# Patient Record
Sex: Male | Born: 1980 | Race: White | Hispanic: No | Marital: Married | Smoking: Former smoker
Health system: Southern US, Community
[De-identification: ages and names within clinical notes are randomized; demographics above are authoritative.]

## PROBLEM LIST (undated history)

## (undated) DIAGNOSIS — D649 Anemia, unspecified: Secondary | ICD-10-CM

## (undated) DIAGNOSIS — F32A Depression, unspecified: Secondary | ICD-10-CM

## (undated) DIAGNOSIS — F329 Major depressive disorder, single episode, unspecified: Secondary | ICD-10-CM

## (undated) DIAGNOSIS — J45909 Unspecified asthma, uncomplicated: Secondary | ICD-10-CM

## (undated) HISTORY — PX: VASECTOMY: SHX75

## (undated) HISTORY — PX: ADENOIDECTOMY: SUR15

## (undated) HISTORY — DX: Anemia, unspecified: D64.9

## (undated) HISTORY — DX: Depression, unspecified: F32.A

## (undated) HISTORY — DX: Unspecified asthma, uncomplicated: J45.909

---
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
## 2011-10-24 ENCOUNTER — Emergency Department: Payer: Self-pay | Admitting: Emergency Medicine

## 2011-10-24 LAB — URINALYSIS, COMPLETE
Ketone: NEGATIVE
Nitrite: NEGATIVE
Protein: NEGATIVE
RBC,UR: <1 /HPF (ref 0–5)
WBC UR: <1 /HPF (ref 0–5)

## 2011-10-24 LAB — DRUG SCREEN, URINE

## 2011-10-24 LAB — TROPONIN I: Troponin-I: <0.02 ng/mL

## 2011-10-24 LAB — BASIC METABOLIC PANEL
BUN: 15 mg/dL (ref 7–18)
Chloride: 102 mmol/L (ref 98–107)
Glucose: 112 mg/dL — ABNORMAL HIGH (ref 65–99)
Osmolality: 277 (ref 275–301)
Potassium: 4 mmol/L (ref 3.5–5.1)
Sodium: 138 mmol/L (ref 136–145)

## 2011-10-24 LAB — CK TOTAL AND CKMB (NOT AT ARMC)
CK, Total: 796 U/L — ABNORMAL HIGH (ref 35–232)
CK-MB: 2.3 ng/mL (ref 0.5–3.6)

## 2011-10-24 LAB — CBC: RDW: 13.6 % (ref 11.5–14.5)

## 2012-02-25 ENCOUNTER — Emergency Department: Payer: Self-pay | Admitting: Emergency Medicine

## 2012-02-25 LAB — CBC
MCV: 90 fL (ref 80–100)
RBC: 4.69 10*6/uL (ref 4.40–5.90)
RDW: 15.9 % — ABNORMAL HIGH (ref 11.5–14.5)
WBC: 8.2 10*3/uL (ref 3.8–10.6)

## 2012-02-25 LAB — COMPREHENSIVE METABOLIC PANEL
Bilirubin,Total: 1.3 mg/dL — ABNORMAL HIGH (ref 0.2–1.0)
Chloride: 99 mmol/L (ref 98–107)
Co2: 26 mmol/L (ref 21–32)
Creatinine: 1.02 mg/dL (ref 0.60–1.30)
EGFR (African American): >60
EGFR (Non-African Amer.): >60
Glucose: 92 mg/dL (ref 65–99)
Osmolality: 268 (ref 275–301)
Sodium: 135 mmol/L — ABNORMAL LOW (ref 136–145)

## 2012-02-25 LAB — URINALYSIS, COMPLETE
Bilirubin,UR: NEGATIVE
Nitrite: NEGATIVE
Ph: 7 (ref 4.5–8.0)
Protein: NEGATIVE
Specific Gravity: 1.008 (ref 1.003–1.030)
Squamous Epithelial: NONE SEEN
WBC UR: <1 /HPF (ref 0–5)

## 2012-08-10 DIAGNOSIS — M7022 Olecranon bursitis, left elbow: Secondary | ICD-10-CM | POA: Insufficient documentation

## 2012-09-26 DIAGNOSIS — K469 Unspecified abdominal hernia without obstruction or gangrene: Secondary | ICD-10-CM | POA: Insufficient documentation

## 2012-09-27 DIAGNOSIS — F410 Panic disorder [episodic paroxysmal anxiety] without agoraphobia: Secondary | ICD-10-CM | POA: Insufficient documentation

## 2012-09-27 DIAGNOSIS — F32A Depression, unspecified: Secondary | ICD-10-CM | POA: Insufficient documentation

## 2013-04-10 ENCOUNTER — Emergency Department (HOSPITAL_COMMUNITY)
Admission: EM | Admit: 2013-04-10 | Discharge: 2013-04-10 | Disposition: A | Discharge status: Home / Self Care | Payer: Managed Care, Other (non HMO) | Attending: Emergency Medicine | Admitting: Emergency Medicine

## 2013-04-10 ENCOUNTER — Encounter (HOSPITAL_COMMUNITY): Payer: Self-pay | Admitting: Emergency Medicine

## 2013-04-10 ENCOUNTER — Other Ambulatory Visit: Payer: Self-pay

## 2013-04-10 DIAGNOSIS — Z79899 Other long term (current) drug therapy: Secondary | ICD-10-CM | POA: Insufficient documentation

## 2013-04-10 DIAGNOSIS — Z87891 Personal history of nicotine dependence: Secondary | ICD-10-CM | POA: Insufficient documentation

## 2013-04-10 DIAGNOSIS — R5381 Other malaise: Secondary | ICD-10-CM | POA: Insufficient documentation

## 2013-04-10 DIAGNOSIS — R55 Syncope and collapse: Secondary | ICD-10-CM | POA: Insufficient documentation

## 2013-04-10 LAB — POCT I-STAT, CHEM 8
BUN: 10 mg/dL (ref 6–23)
Chloride: 105 mEq/L (ref 96–112)
HCT: 40 % (ref 39.0–52.0)
Sodium: 135 mEq/L (ref 135–145)
TCO2: 26 mmol/L (ref 0–100)

## 2013-04-10 LAB — CBC WITH DIFFERENTIAL/PLATELET
Basophils Relative: 0 % (ref 0–1)
Eosinophils Absolute: 0.1 10*3/uL (ref 0.0–0.7)
Eosinophils Relative: 1 % (ref 0–5)
HCT: 40.5 % (ref 39.0–52.0)
Hemoglobin: 14.3 g/dL (ref 13.0–17.0)
Lymphocytes Relative: 18 % (ref 12–46)
Lymphs Abs: 2 10*3/uL (ref 0.7–4.0)
MCH: 30.8 pg (ref 26.0–34.0)
MCHC: 35.3 g/dL (ref 30.0–36.0)
MCV: 87.3 fL (ref 78.0–100.0)
Monocytes Absolute: 1 10*3/uL (ref 0.1–1.0)
Monocytes Relative: 9 % (ref 3–12)
Neutrophils Relative %: 73 % (ref 43–77)
RBC: 4.64 MIL/uL (ref 4.22–5.81)

## 2013-04-10 LAB — COMPREHENSIVE METABOLIC PANEL
Albumin: 4.6 g/dL (ref 3.5–5.2)
BUN: 11 mg/dL (ref 6–23)
CO2: 26 mEq/L (ref 19–32)
Calcium: 9.1 mg/dL (ref 8.4–10.5)
Creatinine, Ser: 1.06 mg/dL (ref 0.50–1.35)
GFR calc Af Amer: >90 mL/min (ref >90)
Total Protein: 7.2 g/dL (ref 6.0–8.3)

## 2013-04-10 LAB — GLUCOSE, CAPILLARY: Glucose-Capillary: 88 mg/dL (ref 70–99)

## 2013-04-10 MED ORDER — SODIUM CHLORIDE 0.9 % IV BOLUS (SEPSIS)
1000.0000 mL | Freq: Once | INTRAVENOUS | Status: AC
  Administered 2013-04-10: 1000 mL via INTRAVENOUS

## 2013-04-10 NOTE — ED Notes (Signed)
EKG was preformed late, due to the fact that the patient in room A3 was not out before EMS arrived with Joel Mills.

## 2013-04-10 NOTE — ED Notes (Signed)
Prior to discharge, pt ambulated around POD A with slow, steady gait. A&Ox4, denied nausea or dizziness.

## 2013-04-10 NOTE — ED Notes (Signed)
Pt given meal as requested by MD. Pt family feeding pt applesauce.

## 2013-04-10 NOTE — ED Notes (Addendum)
Pt in via EMS, per EMS- pt in s/p syncopal episode, states patient had a nerve block today for knee pain and hadn't eaten very much, around 1930 tonight patient felt dizzy and weak and was assisted to the ground by family, episode lasted approx 1 min, denies seizure like activity, was confused for a few min after, upon EMS arrival CBG was 71, given 12.5gm D50 and CBG increased 141, VS within normal limits, IV started PTA, L hand 20g and L AC 20g by EMS, alert and oriented at this time, given fentanyl PO and ativan PO during office procedure today also

## 2013-04-11 LAB — GLUCOSE, CAPILLARY: Glucose-Capillary: 88 mg/dL (ref 70–99)

## 2013-04-11 NOTE — ED Provider Notes (Signed)
CSN: [id number]     Arrival date & time 04/10/13  2017 History   First MD Initiated Contact with Patient 04/10/13 2046     Chief Complaint  Patient presents with  . Loss of Consciousness   (Consider location/radiation/quality/duration/timing/severity/associated sxs/prior Treatment) Patient is a 32 y.o. male presenting with syncope. The history is provided by the patient and the spouse. No language interpreter was used.  Loss of Consciousness Episode history:  Single Most recent episode:  Today Timing:  Rare Progression:  Resolved Chronicity:  New Context: standing up   Witnessed: yes   Relieved by:  Lying down Worsened by:  Posture Associated symptoms: no chest pain, no confusion, no dizziness, no fever, no headaches, no nausea, no shortness of breath, no vomiting and no weakness   Associated symptoms comment:  Fatigue   History reviewed. No pertinent past medical history. History reviewed. No pertinent past surgical history. No family history on file. History  Substance Use Topics  . Smoking status: Former Games developer  . Smokeless tobacco: Not on file  . Alcohol Use: Not on file    Review of Systems  Constitutional: Positive for fatigue. Negative for fever, activity change and appetite change.  HENT: Negative for congestion, facial swelling, rhinorrhea and trouble swallowing.   Eyes: Negative for photophobia and pain.  Respiratory: Negative for cough, chest tightness and shortness of breath.   Cardiovascular: Positive for syncope. Negative for chest pain and leg swelling.  Gastrointestinal: Negative for nausea, vomiting, abdominal pain, diarrhea and constipation.  Endocrine: Negative for polydipsia and polyuria.  Genitourinary: Negative for dysuria, urgency, decreased urine volume and difficulty urinating.  Musculoskeletal: Negative for back pain and gait problem.  Skin: Negative for color change, rash and wound.  Allergic/Immunologic: Negative for immunocompromised state.   Neurological: Positive for syncope. Negative for dizziness, facial asymmetry, speech difficulty, weakness, numbness and headaches.  Psychiatric/Behavioral: Negative for confusion, decreased concentration and agitation.    Allergies  Review of patient's allergies indicates no known allergies.  Home Medications   Current Outpatient Rx  Name  Route  Sig  Dispense  Refill  . omeprazole (PRILOSEC) 20 MG capsule   Oral   Take 20 mg by mouth daily.          BP 116/61  Pulse 66  Temp(Src) 97.5 F (36.4 C) (Oral)  Resp 11  SpO2 99% Physical Exam  Constitutional: He is oriented to person, place, and time. He appears well-developed and well-nourished. No distress.  HENT:  Head: Normocephalic and atraumatic.  Mouth/Throat: No oropharyngeal exudate.  Eyes: Pupils are equal, round, and reactive to light.  Neck: Normal range of motion. Neck supple.  Cardiovascular: Normal rate, regular rhythm and normal heart sounds.  Exam reveals no gallop and no friction rub.   No murmur heard. Pulmonary/Chest: Effort normal and breath sounds normal. No respiratory distress. He has no wheezes. He has no rales.  Abdominal: Soft. Bowel sounds are normal. He exhibits no distension and no mass. There is no tenderness. There is no rebound and no guarding.  Musculoskeletal: Normal range of motion. He exhibits no edema and no tenderness.  Neurological: He is alert and oriented to person, place, and time. He has normal strength. He displays no atrophy and no tremor. No cranial nerve deficit or sensory deficit. He exhibits normal muscle tone. He displays a negative Romberg sign. He displays no seizure activity. Coordination and gait normal. GCS eye subscore is 4. GCS verbal subscore is 5. GCS motor subscore is 6.  Skin:  Skin is warm and dry.  Psychiatric: He has a normal mood and affect.    ED Course  Procedures (including critical care time) Labs Review Labs Reviewed  CBC WITH DIFFERENTIAL - Abnormal;  Notable for the following:    WBC 11.3 (*)    Neutro Abs 8.2 (*)    All other components within normal limits  COMPREHENSIVE METABOLIC PANEL  GLUCOSE, CAPILLARY  POCT I-STAT, CHEM 8   Imaging Review No results found.  EKG Interpretation   None       MDM   1. Vasovagal syncope    Pt is a 32 y.o. male with Pmhx as above who presents with syncopal episode at home after having no PO intake for about 7 hours and having a nerve block done by pain clinic this afternoon.  On PE, pt sleepy but very easily awoken.  No focal neuro findings.  EKG unremarkable, VSS. CBC, CMP unremarkable.  Pt has received 1L NS, able to ambulate w/o difficulty.  Pt safe for d/c.  Return precautions given for new or worsening symptoms including continued episodes, focal neuro complaints.          Shanna Cisco, MD 04/11/13 [phone]

## 2016-05-22 ENCOUNTER — Encounter: Payer: Self-pay | Admitting: Emergency Medicine

## 2016-05-22 ENCOUNTER — Emergency Department: Payer: Managed Care, Other (non HMO)

## 2016-05-22 ENCOUNTER — Emergency Department
Admission: EM | Admit: 2016-05-22 | Discharge: 2016-05-22 | Disposition: A | Discharge status: Home / Self Care | Payer: Managed Care, Other (non HMO) | Attending: Emergency Medicine | Admitting: Emergency Medicine

## 2016-05-22 DIAGNOSIS — Y9351 Activity, roller skating (inline) and skateboarding: Secondary | ICD-10-CM | POA: Insufficient documentation

## 2016-05-22 DIAGNOSIS — Y929 Unspecified place or not applicable: Secondary | ICD-10-CM | POA: Insufficient documentation

## 2016-05-22 DIAGNOSIS — S60221A Contusion of right hand, initial encounter: Secondary | ICD-10-CM

## 2016-05-22 DIAGNOSIS — Y999 Unspecified external cause status: Secondary | ICD-10-CM | POA: Insufficient documentation

## 2016-05-22 DIAGNOSIS — S62114A Nondisplaced fracture of triquetrum [cuneiform] bone, right wrist, initial encounter for closed fracture: Secondary | ICD-10-CM | POA: Insufficient documentation

## 2016-05-22 DIAGNOSIS — Z87891 Personal history of nicotine dependence: Secondary | ICD-10-CM | POA: Insufficient documentation

## 2016-05-22 MED ORDER — HYDROCODONE-ACETAMINOPHEN 5-325 MG PO TABS: 1.0000 | ORAL_TABLET | ORAL | 0 refills | Status: DC | PRN

## 2016-05-22 NOTE — ED Triage Notes (Signed)
Pt fell snow boarding (on a [employer] board) and landed on his rt hand. Pain goes up the arm.
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
## 2016-05-22 NOTE — Discharge Instructions (Signed)
Ice and elevate as needed for pain and swelling. Wear splint for protection and support. Norco as needed for pain.  Make an appointment with Dr. Elenor LegatoHooten's office who is the orthopedist on call this weekend. You will need to have a follow-up appointment to make sure that your wrist is healing.

## 2016-05-22 NOTE — ED Notes (Signed)
NAD noted at time of D/C. Pt denies questions or concerns. Pt ambulatory to the lobby at this time.  

## 2017-11-10 ENCOUNTER — Emergency Department: Payer: Managed Care, Other (non HMO)

## 2017-11-10 ENCOUNTER — Encounter: Payer: Self-pay | Admitting: Emergency Medicine

## 2017-11-10 ENCOUNTER — Emergency Department
Admission: EM | Admit: 2017-11-10 | Discharge: 2017-11-11 | Disposition: A | Discharge status: Home / Self Care | Payer: Managed Care, Other (non HMO) | Attending: Emergency Medicine | Admitting: Emergency Medicine

## 2017-11-10 ENCOUNTER — Other Ambulatory Visit: Payer: Self-pay

## 2017-11-10 DIAGNOSIS — F17200 Nicotine dependence, unspecified, uncomplicated: Secondary | ICD-10-CM | POA: Insufficient documentation

## 2017-11-10 DIAGNOSIS — R42 Dizziness and giddiness: Secondary | ICD-10-CM | POA: Insufficient documentation

## 2017-11-10 DIAGNOSIS — Z79899 Other long term (current) drug therapy: Secondary | ICD-10-CM | POA: Insufficient documentation

## 2017-11-10 DIAGNOSIS — R0602 Shortness of breath: Secondary | ICD-10-CM | POA: Insufficient documentation

## 2017-11-10 DIAGNOSIS — R0789 Other chest pain: Secondary | ICD-10-CM | POA: Insufficient documentation

## 2017-11-10 DIAGNOSIS — F419 Anxiety disorder, unspecified: Secondary | ICD-10-CM | POA: Insufficient documentation

## 2017-11-10 DIAGNOSIS — R112 Nausea with vomiting, unspecified: Secondary | ICD-10-CM | POA: Insufficient documentation

## 2017-11-10 DIAGNOSIS — R079 Chest pain, unspecified: Secondary | ICD-10-CM

## 2017-11-10 LAB — BASIC METABOLIC PANEL
Anion gap: 8 (ref 5–15)
BUN: 10 mg/dL (ref 6–20)
CHLORIDE: 107 mmol/L (ref 98–111)
CO2: 26 mmol/L (ref 22–32)
CREATININE: 1.06 mg/dL (ref 0.61–1.24)
Calcium: 9.4 mg/dL (ref 8.9–10.3)
GFR calc Af Amer: >60 mL/min (ref >60)
GFR calc non Af Amer: >60 mL/min (ref >60)
Glucose, Bld: 93 mg/dL (ref 70–99)
Potassium: 3.6 mmol/L (ref 3.5–5.1)
SODIUM: 141 mmol/L (ref 135–145)

## 2017-11-10 LAB — CBC
HCT: 44.3 % (ref 40.0–52.0)
Hemoglobin: 15.8 g/dL (ref 13.0–18.0)
MCH: 31.6 pg (ref 26.0–34.0)
MCHC: 35.6 g/dL (ref 32.0–36.0)
MCV: 88.9 fL (ref 80.0–100.0)
PLATELETS: 188 10*3/uL (ref 150–440)
RBC: 4.98 MIL/uL (ref 4.40–5.90)
RDW: 13.5 % (ref 11.5–14.5)
WBC: 7.7 10*3/uL (ref 3.8–10.6)

## 2017-11-10 LAB — TROPONIN I: Troponin I: <0.03 ng/mL (ref <0.03)

## 2017-11-10 NOTE — ED Triage Notes (Signed)
Pt arrives POV to triage with c/o chest pain x a few months. Pt reports that he was here at around 1930 but was sitting in the parking lot to see if it went away. Pt reports that he left and was told to come back from a friend. Pt is in NAD.

## 2017-11-11 ENCOUNTER — Emergency Department: Payer: Self-pay

## 2017-11-11 LAB — TROPONIN I: Troponin I: <0.03 ng/mL (ref <0.03)

## 2017-11-11 MED ORDER — ASPIRIN 81 MG PO CHEW
324.0000 mg | CHEWABLE_TABLET | Freq: Once | ORAL | Status: AC
Start: 2017-11-11 — End: 2017-11-11
  Administered 2017-11-11: 324 mg via ORAL
  Filled 2017-11-11: qty 4

## 2017-11-11 MED ORDER — GI COCKTAIL ~~LOC~~
30.0000 mL | Freq: Once | ORAL | Status: AC
  Administered 2017-11-11: 30 mL via ORAL
  Filled 2017-11-11: qty 30

## 2017-11-11 NOTE — ED Provider Notes (Signed)
[facility] Emergency Department Provider Note   ____________________________________________   First MD Initiated Contact with Patient 11/10/17 2355     (approximate)  I have reviewed the triage vital signs and the nursing notes.   HISTORY  Chief Complaint Chest Pain    HPI Joel Mills is a 37 y.o. male who comes into the hospital today with 3 months of intermittent left-sided chest pain.  The patient states that he would have some chest pain that would take his breath away.  It would feel like someone was standing or his Annia FriendlyChester someone was squeezing his chest.  The patient has been having also symptoms of vertigo.  He reports that it usually occurs when he goes from kneeling to standing or sitting to standing.  He gets up and it seems like his head is spinning.  Those episodes last about 30 seconds but he says that the chest pain will last for about 5 minutes.  The patient states that today he felt like he was having an anxiety attack which made all the symptoms worse.  He had some shortness of breath with the chest pain as well as nausea and he vomited once.  The patient denies any pain right now and states that his chest is just sore.  He rates his soreness a 2 out of 10 in intensity.  The patient states that he has a friend who is younger than him who is having some heart problems so he was concerned about his own symptoms.  He is here for evaluation.  History reviewed. No pertinent past medical history.  There are no active problems to display for this patient.   History reviewed. No pertinent surgical history.  Prior to Admission medications   Medication Sig Start Date End Date Taking? Authorizing Provider  HYDROcodone-acetaminophen (NORCO/VICODIN) 5-325 MG tablet Take 1 tablet by mouth every 4 (four) hours as needed for moderate pain. 05/22/16   Tommi RumpsSummers, Rhonda L, PA-C  omeprazole (PRILOSEC) 20 MG capsule Take 20 mg by mouth daily.    [provider]    Allergies Patient has no known allergies.  No family history on file.  Social History Social History   Tobacco Use  . Smoking status: Current Every Day Smoker  . Smokeless tobacco: Never Used  Substance Use Topics  . Alcohol use: Yes    Comment: socially  . Drug use: Yes    Types: Marijuana    Review of Systems  Constitutional: No fever/chills Eyes: No visual changes. ENT: No sore throat. Cardiovascular:  chest pain. Respiratory: shortness of breath. Gastrointestinal: Nausea and vomiting with no abdominal pain.  No diarrhea.  No constipation. Genitourinary: Negative for dysuria. Musculoskeletal: Negative for back pain. Skin: Negative for rash. Neurological: Dizziness   ____________________________________________   PHYSICAL EXAM:  VITAL SIGNS: ED Triage Vitals  Enc Vitals Group     BP 11/10/17 2220 135/85     Pulse Rate 11/10/17 2220 62     Resp 11/10/17 2220 18     Temp 11/10/17 2220 98.4 F (36.9 C)     Temp Source 11/10/17 2220 Oral     SpO2 11/10/17 2220 96 %     Weight 11/10/17 2218 193 lb (87.5 kg)     Height 11/10/17 2218 6\' 3"  (1.905 m)     Head Circumference --      Peak Flow --      Pain Score 11/10/17 2218 2     Pain Loc --  Pain Edu? --      Excl. in GC? --     Constitutional: Alert and oriented. Well appearing and in mild distress. Eyes: Conjunctivae are normal. PERRL. EOMI. Head: Atraumatic. Nose: No congestion/rhinnorhea. Mouth/Throat: Mucous membranes are moist.  Oropharynx non-erythematous. Cardiovascular: Normal rate, regular rhythm. Grossly normal heart sounds.  Good peripheral circulation. Respiratory: Normal respiratory effort.  No retractions. Lungs CTAB. Gastrointestinal: Soft and nontender. No distention.  Positive bowel sounds Musculoskeletal: No lower extremity tenderness nor edema.  . Neurologic:  Normal speech and language.  Skin:  Skin is warm, dry and intact.  Psychiatric: Mood and affect are  normal.  ____________________________________________   LABS (all labs ordered are listed, but only abnormal results are displayed)  Labs Reviewed  BASIC METABOLIC PANEL  CBC  TROPONIN I  TROPONIN I   ____________________________________________  EKG  ED ECG REPORT I, Rebecka Apley, the attending physician, personally viewed and interpreted this ECG.   Date: 11/10/2017  EKG Time: 2221  Rate: 69  Rhythm: normal sinus rhythm with sinus arrythmia  Axis: normal  Intervals:none  ST&T Change: none  ____________________________________________  RADIOLOGY  ED MD interpretation:  CXR: Stable normal chest radiograph  CT head: Normal head CT  Official radiology report(s): Dg Chest 2 View  Result Date: 11/10/2017 CLINICAL DATA:  37 y/o  M; chest pain. EXAM: CHEST - 2 VIEW COMPARISON:  02/25/2012 chest radiograph. FINDINGS: Stable heart size and mediastinal contours are within normal limits. Both lungs are clear. The visualized skeletal structures are unremarkable. IMPRESSION: Stable normal chest radiograph. Electronically Signed   By: Mitzi Hansen M.D.   On: 11/10/2017 22:37   Ct Head Wo Contrast  Result Date: 11/11/2017 CLINICAL DATA:  Chest pain and vertigo. EXAM: CT HEAD WITHOUT CONTRAST TECHNIQUE: Contiguous axial images were obtained from the base of the skull through the vertex without intravenous contrast. COMPARISON:  None. FINDINGS: Brain: There is no mass, hemorrhage or extra-axial collection. The size and configuration of the ventricles and extra-axial CSF spaces are normal. There is no acute or chronic infarction. The brain parenchyma is normal. Vascular: No abnormal hyperdensity of the major intracranial arteries or dural venous sinuses. No intracranial atherosclerosis. Skull: The visualized skull base, calvarium and extracranial soft tissues are normal. Sinuses/Orbits: No fluid levels or advanced mucosal thickening of the visualized paranasal sinuses. No  mastoid or middle ear effusion. The orbits are normal. IMPRESSION: Normal head CT. Electronically Signed   By: Deatra Robinson M.D.   On: 11/11/2017 01:14    ____________________________________________   PROCEDURES  Procedure(s) performed: None  Procedures  Critical Care performed: No  ____________________________________________   INITIAL IMPRESSION / ASSESSMENT AND PLAN / ED COURSE  As part of my medical decision making, I reviewed the following data within the electronic MEDICAL RECORD NUMBER Notes from prior ED visits and [place] Controlled Substance Database   This is a 37 year old male who comes into the hospital today with some intermittent chest pain for multiple months and some dizziness.  The patient was concerned as the symptoms have been increasing in frequency.  My differential diagnosis includes acute coronary syndrome, vertigo, anxiety.  We did check some blood work to include a CBC a BMP and a troponin.  The patient's blood work is unremarkable.  He also had a chest x-ray and a CT of his head which were negative.  The patient's repeat troponin is also negative.  He will be discharged home and encouraged to follow-up with the acute care clinic or  his primary care physician for further evaluation of the symptoms.      ____________________________________________   FINAL CLINICAL IMPRESSION(S) / ED DIAGNOSES  Final diagnoses:  Chest pain, unspecified type  Dizziness     ED Discharge Orders    None       Note:  This document was prepared using Dragon voice recognition software and may include unintentional dictation errors.    Rebecka Apley, MD 11/11/17 [phone]

## 2017-11-11 NOTE — Discharge Instructions (Addendum)
Please follow-up with the acute care clinic for further evaluation of your chest pain and dizziness symptoms.  Please ensure that you are drinking adequate fluids and please return with any worsening symptoms or any other concerns.

## 2017-12-18 ENCOUNTER — Emergency Department: Payer: Self-pay

## 2017-12-18 ENCOUNTER — Emergency Department
Admission: EM | Admit: 2017-12-18 | Discharge: 2017-12-18 | Disposition: A | Discharge status: Home / Self Care | Payer: Self-pay | Attending: Emergency Medicine | Admitting: Emergency Medicine

## 2017-12-18 ENCOUNTER — Other Ambulatory Visit: Payer: Self-pay

## 2017-12-18 ENCOUNTER — Encounter: Payer: Self-pay | Admitting: Emergency Medicine

## 2017-12-18 DIAGNOSIS — Z79899 Other long term (current) drug therapy: Secondary | ICD-10-CM | POA: Insufficient documentation

## 2017-12-18 DIAGNOSIS — F17200 Nicotine dependence, unspecified, uncomplicated: Secondary | ICD-10-CM | POA: Insufficient documentation

## 2017-12-18 DIAGNOSIS — M5432 Sciatica, left side: Secondary | ICD-10-CM | POA: Insufficient documentation

## 2017-12-18 MED ORDER — METHOCARBAMOL 500 MG PO TABS: 500.0000 mg | ORAL_TABLET | Freq: Every day | ORAL | 1 refills | Status: DC

## 2017-12-18 MED ORDER — MELOXICAM 15 MG PO TABS: 15.0000 mg | ORAL_TABLET | Freq: Every day | ORAL | 1 refills | Status: DC

## 2017-12-18 NOTE — ED Notes (Signed)
See triage note  Presents with lower back pain   States he has had ower back pain in the past  But usually pain moves into buttocks  Now states pain is moving into left leg  Ambulates slowly d/t pain   Denies any recent injury

## 2017-12-18 NOTE — ED Provider Notes (Signed)
[facility] Emergency Department Provider Note  ____________________________________________  Time seen: Approximately 4:16 PM  I have reviewed the triage vital signs and the nursing notes.   HISTORY  Chief Complaint Back Pain    HPI Joel Mills is a 37 y.o. male who presents the emergency department complaining of increasing lower back pain with radicular symptoms down the left leg.  Patient reports that he has had intermittent lower back pain but more radicular symptoms down his left lower leg for a long period of time.  Patient reports over the past several weeks radicular symptoms have been increasing.  Patient reports that he has had some "popping" in his lower back recently with increasing pain and radicular symptoms.  No bowel or bladder dysfunction, saddle anesthesia, paresthesias.  No urinary complaints.  No GI complaints.  No medications for this complaint prior to arrival.  No other complaints at this time.    History reviewed. No pertinent past medical history.  There are no active problems to display for this patient.   History reviewed. No pertinent surgical history.  Prior to Admission medications   Medication Sig Start Date End Date Taking? Authorizing Provider  HYDROcodone-acetaminophen (NORCO/VICODIN) 5-325 MG tablet Take 1 tablet by mouth every 4 (four) hours as needed for moderate pain. 05/22/16   Joel Mills, Joel Mills, Joel Mills  meloxicam (MOBIC) 15 MG tablet Take 1 tablet (15 mg total) by mouth daily. 12/18/17   Joel Mills, Joel Mills, Joel Mills  methocarbamol (ROBAXIN) 500 MG tablet Take 1 tablet (500 mg total) by mouth at bedtime. 12/18/17   Joel Mills, Joel Mills, Joel Mills  omeprazole (PRILOSEC) 20 MG capsule Take 20 mg by mouth daily.    [provider]    Allergies Patient has no known allergies.  No family history on file.  Social History Social History   Tobacco Use  . Smoking status: Current Every Day Smoker  . Smokeless tobacco:  Never Used  Substance Use Topics  . Alcohol use: Yes    Comment: socially  . Drug use: Yes    Types: Marijuana     Review of Systems  Constitutional: No fever/chills Eyes: No visual changes. No discharge ENT: No upper respiratory complaints. Cardiovascular: no chest pain. Respiratory: no cough. No SOB. Gastrointestinal: No abdominal pain.  No nausea, no vomiting.  No diarrhea.  No constipation. Genitourinary: Negative for dysuria. No hematuria Musculoskeletal: Positive for left lower back pain with radicular symptoms down the left leg Skin: Negative for rash, abrasions, lacerations, ecchymosis. Neurological: Negative for headaches, focal weakness or numbness. 10-point ROS otherwise negative.  ____________________________________________   PHYSICAL EXAM:  VITAL SIGNS: ED Triage Vitals  Enc Vitals Group     BP 12/18/17 1608 119/71     Pulse Rate 12/18/17 1608 97     Resp 12/18/17 1608 16     Temp 12/18/17 1608 98.5 F (36.9 C)     Temp Source 12/18/17 1608 Oral     SpO2 12/18/17 1608 99 %     Weight 12/18/17 1604 192 lb 14.4 oz (87.5 kg)     Height 12/18/17 1604 5\' 11"  (1.803 m)     Head Circumference --      Peak Flow --      Pain Score 12/18/17 1603 6     Pain Loc --      Pain Edu? --      Excl. in GC? --      Constitutional: Alert and oriented. Well appearing and in no acute distress. Eyes:  Conjunctivae are normal. PERRL. EOMI. Head: Atraumatic. Neck: No stridor.    Cardiovascular: Normal rate, regular rhythm. Normal S1 and S2.  Good peripheral circulation. Respiratory: Normal respiratory effort without tachypnea or retractions. Lungs CTAB. Good air entry to the bases with no decreased or absent breath sounds. Gastrointestinal: Bowel sounds 4 quadrants. Soft and nontender to palpation. No guarding or rigidity. No palpable masses. No distention. No CVA tenderness. Musculoskeletal: Full range of motion to all extremities. No gross deformities  appreciated.Visualization of the lumbar spine reveals no deformity or gross abnormality.  Full range of motion to the lumbar spine.  Mild tenderness to palpation over the L3-L5 region.  No palpable abnormality or step-off.  Palpation along the left paraspinal muscle group elicits tenderness in this region as well.  Positive for tenderness to palpation of left-sided sciatic notch.  Negative straight leg raise bilaterally.  Dorsalis pedis pulse intact bilateral lower extremities.  Sensation intact and equal bilateral lower extremities. Neurologic:  Normal speech and language. No gross focal neurologic deficits are appreciated.  Skin:  Skin is warm, dry and intact. No rash noted. Psychiatric: Mood and affect are normal. Speech and behavior are normal. Patient exhibits appropriate insight and judgement.   ____________________________________________   LABS (all labs ordered are listed, but only abnormal results are displayed)  Labs Reviewed - No data to display ____________________________________________  EKG   ____________________________________________  RADIOLOGY I personally viewed and evaluated these images as part of my medical decision making, as well as reviewing the written report by the radiologist.  Elspeth Cho radiologist finding of no acute osseous abnormality to the lumbar spine.  Dg Lumbar Spine Complete  Result Date: 12/18/2017 CLINICAL DATA:  Low back pain radiating down the left leg. EXAM: LUMBAR SPINE - COMPLETE 4+ VIEW COMPARISON:  None. FINDINGS: Five lumbar type vertebral bodies show minimal thoracolumbar curvature convex to the left and lower lumbar curvature convex to the right. No antero or retrolisthesis. No disc space narrowing. No facet arthropathy or pars defect. IMPRESSION: Minimal spinal curvature, likely subclinical. No disc or joint degenerative changes. Electronically Signed   By: Paulina Fusi M.Mills.   On: 12/18/2017 16:52     ____________________________________________    PROCEDURES  Procedure(s) performed:    Procedures    Medications - No data to display   ____________________________________________   INITIAL IMPRESSION / ASSESSMENT AND PLAN / ED COURSE  Pertinent labs & imaging results that were available during my care of the patient were reviewed by me and considered in my medical decision making (see chart for details).  Review of the [place] CSRS was performed in accordance of the [facility] prior to dispensing any controlled drugs.      Patient's diagnosis is consistent with sciatica.  Patient presents the emergency department with ongoing left lower extremity pain radiating from his back.  X-ray was reassuring with no acute findings.  Exam was overall reassuring with no neuro deficits.  Meloxicam and Robaxin are prescribed for symptom relief.  If patient does not improve, he will follow-up with orthopedics. Patient is given ED precautions to return to the ED for any worsening or new symptoms.     ____________________________________________  FINAL CLINICAL IMPRESSION(S) / ED DIAGNOSES  Final diagnoses:  Sciatica of left side      NEW MEDICATIONS STARTED DURING THIS VISIT:  ED Discharge Orders         Ordered    meloxicam (MOBIC) 15 MG tablet  Daily     12/18/17 1750  methocarbamol (ROBAXIN) 500 MG tablet  Daily at bedtime     12/18/17 1750              This chart was dictated using voice recognition software/Dragon. Despite best efforts to proofread, errors can occur which can change the meaning. Any change was purely unintentional.    Racheal PatchesCuthriell, Brendalyn Vallely Mills, Joel Mills 12/18/17 1755    Phineas SemenGoodman, Graydon, MD 12/18/17 2026

## 2017-12-18 NOTE — ED Triage Notes (Signed)
C/O left lower back pain radiating down left leg.  STates pain has been ongoing for a while.

## 2017-12-29 ENCOUNTER — Ambulatory Visit
Admission: EM | Admit: 2017-12-29 | Discharge: 2017-12-29 | Disposition: A | Discharge status: Home / Self Care | Payer: Medicaid Other | Attending: Family Medicine | Admitting: Family Medicine

## 2017-12-29 ENCOUNTER — Other Ambulatory Visit: Payer: Self-pay

## 2017-12-29 ENCOUNTER — Encounter: Payer: Self-pay | Admitting: Emergency Medicine

## 2017-12-29 ENCOUNTER — Ambulatory Visit: Payer: Self-pay

## 2017-12-29 DIAGNOSIS — A084 Viral intestinal infection, unspecified: Secondary | ICD-10-CM | POA: Insufficient documentation

## 2017-12-29 LAB — URINALYSIS, COMPLETE (UACMP) WITH MICROSCOPIC
BACTERIA UA: NONE SEEN
BILIRUBIN URINE: NEGATIVE
GLUCOSE, UA: NEGATIVE mg/dL
Hgb urine dipstick: NEGATIVE
KETONES UR: NEGATIVE mg/dL
LEUKOCYTES UA: NEGATIVE
Nitrite: NEGATIVE
PH: 5.5 (ref 5.0–8.0)
PROTEIN: NEGATIVE mg/dL
RBC / HPF: NONE SEEN RBC/hpf (ref 0–5)
Specific Gravity, Urine: 1.02 (ref 1.005–1.030)
WBC UA: NONE SEEN WBC/hpf (ref 0–5)

## 2017-12-29 LAB — COMPREHENSIVE METABOLIC PANEL
ALT: 16 U/L (ref 0–44)
ANION GAP: 10 (ref 5–15)
AST: 19 U/L (ref 15–41)
Albumin: 4.6 g/dL (ref 3.5–5.0)
Alkaline Phosphatase: 95 U/L (ref 38–126)
BILIRUBIN TOTAL: 1.1 mg/dL (ref 0.3–1.2)
BUN: 13 mg/dL (ref 6–20)
CO2: 25 mmol/L (ref 22–32)
CREATININE: 1.04 mg/dL (ref 0.61–1.24)
Calcium: 9.1 mg/dL (ref 8.9–10.3)
Chloride: 100 mmol/L (ref 98–111)
GFR calc non Af Amer: >60 mL/min (ref >60)
Glucose, Bld: 103 mg/dL — ABNORMAL HIGH (ref 70–99)
Potassium: 3.8 mmol/L (ref 3.5–5.1)
Sodium: 135 mmol/L (ref 135–145)
TOTAL PROTEIN: 7.6 g/dL (ref 6.5–8.1)

## 2017-12-29 LAB — CBC WITH DIFFERENTIAL/PLATELET
Basophils Absolute: 0 10*3/uL (ref 0–0.1)
Basophils Relative: 0 %
Eosinophils Absolute: 0 10*3/uL (ref 0–0.7)
Eosinophils Relative: 1 %
HEMATOCRIT: 44.9 % (ref 40.0–52.0)
Hemoglobin: 15.5 g/dL (ref 13.0–18.0)
Lymphocytes Relative: 21 %
Lymphs Abs: 1.7 10*3/uL (ref 1.0–3.6)
MCH: 31 pg (ref 26.0–34.0)
MCHC: 34.6 g/dL (ref 32.0–36.0)
MCV: 89.5 fL (ref 80.0–100.0)
MONOS PCT: 9 %
Monocytes Absolute: 0.8 10*3/uL (ref 0.2–1.0)
Neutro Abs: 5.7 10*3/uL (ref 1.4–6.5)
Neutrophils Relative %: 69 %
Platelets: 193 10*3/uL (ref 150–440)
RBC: 5.02 MIL/uL (ref 4.40–5.90)
RDW: 13.5 % (ref 11.5–14.5)
WBC: 8.3 10*3/uL (ref 3.8–10.6)

## 2017-12-29 LAB — LIPASE, BLOOD: Lipase: 30 U/L (ref 11–51)

## 2017-12-29 MED ORDER — ONDANSETRON 8 MG PO TBDP: 8.0000 mg | ORAL_TABLET | Freq: Three times a day (TID) | ORAL | 0 refills | Status: DC | PRN

## 2017-12-29 MED ORDER — ONDANSETRON 8 MG PO TBDP
8.0000 mg | ORAL_TABLET | Freq: Once | ORAL | Status: AC
  Administered 2017-12-29: 8 mg via ORAL

## 2017-12-29 NOTE — ED Triage Notes (Signed)
Patient c/o mid abd pain and nausea that started on Tuesday. Patient stated on Wednesday he started having episodes of vomiting and diarrhea.

## 2017-12-30 NOTE — ED Provider Notes (Signed)
[facility]    CSN: [id number] Arrival date & time: 12/29/17  1701     History   Chief Complaint Chief Complaint  Patient presents with  . Abdominal Pain    HPI Joel Mills is a 37 y.o. male.   37 yo male with a c/o 4 days h/o abdominal pains, nausea, vomiting and watery diarrhea. Denies any fevers, but has had chills. No known suspicious foods or sick contacts.  The history is provided by the patient.  Abdominal Pain    History reviewed. No pertinent past medical history.  There are no active problems to display for this patient.   History reviewed. No pertinent surgical history.     Home Medications    Prior to Admission medications   Medication Sig Start Date End Date Taking? Authorizing Provider  HYDROcodone-acetaminophen (NORCO/VICODIN) 5-325 MG tablet Take 1 tablet by mouth every 4 (four) hours as needed for moderate pain. 05/22/16   Tommi RumpsSummers, Rhonda L, PA-C  meloxicam (MOBIC) 15 MG tablet Take 1 tablet (15 mg total) by mouth daily. 12/18/17   Cuthriell, Delorise RoyalsJonathan D, PA-C  methocarbamol (ROBAXIN) 500 MG tablet Take 1 tablet (500 mg total) by mouth at bedtime. 12/18/17   Cuthriell, Delorise RoyalsJonathan D, PA-C  omeprazole (PRILOSEC) 20 MG capsule Take 20 mg by mouth daily.    [provider]  ondansetron (ZOFRAN ODT) 8 MG disintegrating tablet Take 1 tablet (8 mg total) by mouth every 8 (eight) hours as needed. 12/29/17   Payton Mccallumonty, Tierrah Anastos, MD    Family History Family History  Problem Relation Age of Onset  . Diabetes Mother   . Hypertension Father     Social History Social History   Tobacco Use  . Smoking status: Former Games developermoker  . Smokeless tobacco: Never Used  Substance Use Topics  . Alcohol use: Yes    Comment: socially  . Drug use: Yes    Types: Marijuana     Allergies   Patient has no known allergies.   Review of Systems Review of Systems  Gastrointestinal: Positive for abdominal pain.     Physical Exam Triage Vital Signs ED  Triage Vitals  Enc Vitals Group     BP 12/29/17 1716 128/86     Pulse Rate 12/29/17 1716 70     Resp 12/29/17 1716 18     Temp 12/29/17 1716 98.6 F (37 C)     Temp Source 12/29/17 1716 Oral     SpO2 12/29/17 1716 100 %     Weight 12/29/17 1713 195 lb (88.5 kg)     Height 12/29/17 1713 6\' 2"  (1.88 m)     Head Circumference --      Peak Flow --      Pain Score 12/29/17 1713 6     Pain Loc --      Pain Edu? --      Excl. in GC? --    No data found.  Updated Vital Signs BP 128/86 (BP Location: Left Arm)   Pulse 70   Temp 98.6 F (37 C) (Oral)   Resp 18   Ht 6\' 2"  (1.88 m)   Wt 88.5 kg   SpO2 100%   BMI 25.04 kg/m   Visual Acuity Right Eye Distance:   Left Eye Distance:   Bilateral Distance:    Right Eye Near:   Left Eye Near:    Bilateral Near:     Physical Exam  Constitutional: He is oriented to person, place, and time. He appears  well-developed and well-nourished. No distress.  HENT:  Head: Normocephalic and atraumatic.  Cardiovascular: Normal rate, regular rhythm, normal heart sounds and intact distal pulses.  No murmur heard. Pulmonary/Chest: Effort normal and breath sounds normal. No respiratory distress. He has no wheezes. He has no rales.  Abdominal: Soft. Bowel sounds are normal. He exhibits no distension and no mass. There is tenderness (mild, diffuse; no rebound or guarding). There is no rebound and no guarding.  Neurological: He is alert and oriented to person, place, and time.  Skin: No rash noted. He is not diaphoretic.  Nursing note and vitals reviewed.    UC Treatments / Results  Labs (all labs ordered are listed, but only abnormal results are displayed) Labs Reviewed  COMPREHENSIVE METABOLIC PANEL - Abnormal; Notable for the following components:      Result Value   Glucose, Bld 103 (*)    All other components within normal limits  URINALYSIS, COMPLETE (UACMP) WITH MICROSCOPIC  CBC WITH DIFFERENTIAL/PLATELET  LIPASE, BLOOD     EKG None  Radiology Dg Abd 2 Views  Result Date: 12/29/2017 CLINICAL DATA:  Epigastric pain, vomiting and diarrhea in EXAM: ABDOMEN - 2 VIEW COMPARISON:  None. FINDINGS: The bowel gas pattern is normal. There is no evidence of free air. No radio-opaque calculi or other significant radiographic abnormality is seen. IMPRESSION: Negative. Electronically Signed   By: Deatra Robinson M.D.   On: 12/29/2017 18:26    Procedures Procedures (including critical care time)  Medications Ordered in UC Medications  ondansetron (ZOFRAN-ODT) disintegrating tablet 8 mg (8 mg Oral Given 12/29/17 1756)    Initial Impression / Assessment and Plan / UC Course  I have reviewed the triage vital signs and the nursing notes.  Pertinent labs & imaging results that were available during my care of the patient were reviewed by me and considered in my medical decision making (see chart for details).      Final Clinical Impressions(s) / UC Diagnoses   Final diagnoses:  Viral gastroenteritis    ED Prescriptions    Medication Sig Dispense Auth. Provider   ondansetron (ZOFRAN ODT) 8 MG disintegrating tablet Take 1 tablet (8 mg total) by mouth every 8 (eight) hours as needed. 6 tablet Payton Mccallum, MD      1. Labs/x-ray results and diagnosis reviewed with patient 2. rx as per orders above; reviewed possible side effects, interactions, risks and benefits  3. Recommend supportive treatment with rest, fluids 4. Follow-up prn if symptoms worsen or don't improve  Controlled Substance Prescriptions  Controlled Substance Registry consulted? Not Applicable   Payton Mccallum, MD 12/30/17 1100

## 2018-01-06 ENCOUNTER — Encounter: Payer: Self-pay | Admitting: Emergency Medicine

## 2018-01-06 ENCOUNTER — Emergency Department: Payer: Medicaid Other

## 2018-01-06 ENCOUNTER — Inpatient Hospital Stay
Admission: EM | Admit: 2018-01-06 | Discharge: 2018-01-07 | DRG: 378 | Disposition: A | Discharge status: Home / Self Care | Payer: Medicaid Other | Attending: Internal Medicine | Admitting: Internal Medicine

## 2018-01-06 ENCOUNTER — Other Ambulatory Visit: Payer: Self-pay

## 2018-01-06 DIAGNOSIS — D62 Acute posthemorrhagic anemia: Secondary | ICD-10-CM | POA: Diagnosis present

## 2018-01-06 DIAGNOSIS — F17210 Nicotine dependence, cigarettes, uncomplicated: Secondary | ICD-10-CM | POA: Diagnosis present

## 2018-01-06 DIAGNOSIS — K922 Gastrointestinal hemorrhage, unspecified: Secondary | ICD-10-CM | POA: Diagnosis present

## 2018-01-06 DIAGNOSIS — Z791 Long term (current) use of non-steroidal anti-inflammatories (NSAID): Secondary | ICD-10-CM | POA: Diagnosis not present

## 2018-01-06 DIAGNOSIS — Z79899 Other long term (current) drug therapy: Secondary | ICD-10-CM

## 2018-01-06 DIAGNOSIS — T39395A Adverse effect of other nonsteroidal anti-inflammatory drugs [NSAID], initial encounter: Secondary | ICD-10-CM | POA: Diagnosis present

## 2018-01-06 DIAGNOSIS — K264 Chronic or unspecified duodenal ulcer with hemorrhage: Secondary | ICD-10-CM | POA: Diagnosis present

## 2018-01-06 DIAGNOSIS — E876 Hypokalemia: Secondary | ICD-10-CM | POA: Diagnosis present

## 2018-01-06 LAB — CBC WITH DIFFERENTIAL/PLATELET
BASOS ABS: 0 10*3/uL (ref 0–0.1)
Basophils Relative: 0 %
EOS ABS: 0.1 10*3/uL (ref 0–0.7)
EOS PCT: 1 %
HCT: 22.3 % — ABNORMAL LOW (ref 40.0–52.0)
Hemoglobin: 8 g/dL — ABNORMAL LOW (ref 13.0–18.0)
LYMPHS PCT: 26 %
Lymphs Abs: 4.2 10*3/uL — ABNORMAL HIGH (ref 1.0–3.6)
MCH: 32.1 pg (ref 26.0–34.0)
MCHC: 35.9 g/dL (ref 32.0–36.0)
MCV: 89.4 fL (ref 80.0–100.0)
Monocytes Absolute: 1.3 10*3/uL — ABNORMAL HIGH (ref 0.2–1.0)
Monocytes Relative: 8 %
Neutro Abs: 10.4 10*3/uL — ABNORMAL HIGH (ref 1.4–6.5)
Neutrophils Relative %: 65 %
PLATELETS: 237 10*3/uL (ref 150–440)
RBC: 2.49 MIL/uL — ABNORMAL LOW (ref 4.40–5.90)
RDW: 14.1 % (ref 11.5–14.5)
WBC: 16.1 10*3/uL — ABNORMAL HIGH (ref 3.8–10.6)

## 2018-01-06 LAB — COMPREHENSIVE METABOLIC PANEL
ALBUMIN: 3.7 g/dL (ref 3.5–5.0)
ALK PHOS: 57 U/L (ref 38–126)
ALT: 14 U/L (ref 0–44)
AST: 21 U/L (ref 15–41)
Anion gap: 10 (ref 5–15)
BILIRUBIN TOTAL: 0.7 mg/dL (ref 0.3–1.2)
BUN: 35 mg/dL — ABNORMAL HIGH (ref 6–20)
CALCIUM: 8.4 mg/dL — AB (ref 8.9–10.3)
CO2: 20 mmol/L — AB (ref 22–32)
CREATININE: 0.88 mg/dL (ref 0.61–1.24)
Chloride: 106 mmol/L (ref 98–111)
GFR calc Af Amer: >60 mL/min (ref >60)
GFR calc non Af Amer: >60 mL/min (ref >60)
GLUCOSE: 149 mg/dL — AB (ref 70–99)
Potassium: 3.3 mmol/L — ABNORMAL LOW (ref 3.5–5.1)
SODIUM: 136 mmol/L (ref 135–145)
Total Protein: 5.8 g/dL — ABNORMAL LOW (ref 6.5–8.1)

## 2018-01-06 LAB — TROPONIN I: Troponin I: <0.03 ng/mL (ref <0.03)

## 2018-01-06 LAB — LIPASE, BLOOD: Lipase: 35 U/L (ref 11–51)

## 2018-01-06 LAB — ABO/RH: ABO/RH(D): A POS

## 2018-01-06 LAB — PREPARE RBC (CROSSMATCH)

## 2018-01-06 LAB — LACTIC ACID, PLASMA
Lactic Acid, Venous: 4.4 mmol/L (ref 0.5–1.9)
Lactic Acid, Venous: 1.6 mmol/L (ref 0.5–1.9)

## 2018-01-06 MED ORDER — NICOTINE 14 MG/24HR TD PT24
14.0000 mg | MEDICATED_PATCH | Freq: Every day | TRANSDERMAL | Status: DC
  Filled 2018-01-06 (×2): qty 1

## 2018-01-06 MED ORDER — ONDANSETRON HCL 4 MG/2ML IJ SOLN
4.0000 mg | Freq: Once | INTRAMUSCULAR | Status: AC
  Administered 2018-01-06: 4 mg via INTRAVENOUS
  Filled 2018-01-06: qty 2

## 2018-01-06 MED ORDER — SODIUM CHLORIDE 0.9 % IV SOLN
8.0000 mg/h | INTRAVENOUS | Status: DC
  Administered 2018-01-07: 8 mg/h via INTRAVENOUS
  Filled 2018-01-06 (×3): qty 80

## 2018-01-06 MED ORDER — SODIUM CHLORIDE 0.9 % IV SOLN
50.0000 ug/h | INTRAVENOUS | Status: DC
  Administered 2018-01-06 – 2018-01-07 (×2): 50 ug/h via INTRAVENOUS
  Filled 2018-01-06 (×6): qty 1

## 2018-01-06 MED ORDER — ONDANSETRON HCL 4 MG PO TABS: 4.0000 mg | ORAL_TABLET | Freq: Four times a day (QID) | ORAL | Status: DC | PRN

## 2018-01-06 MED ORDER — SODIUM CHLORIDE 0.9 % IV BOLUS
1000.0000 mL | Freq: Once | INTRAVENOUS | Status: AC
  Administered 2018-01-06: 1000 mL via INTRAVENOUS

## 2018-01-06 MED ORDER — SODIUM CHLORIDE 0.9 % IV SOLN
10.0000 mL/h | Freq: Once | INTRAVENOUS | Status: AC
  Administered 2018-01-07: 10:00:00 via INTRAVENOUS

## 2018-01-06 MED ORDER — PANTOPRAZOLE SODIUM 40 MG IV SOLR: 40.0000 mg | Freq: Two times a day (BID) | INTRAVENOUS | Status: DC

## 2018-01-06 MED ORDER — SODIUM CHLORIDE 0.9 % IV SOLN
80.0000 mg | Freq: Once | INTRAVENOUS | Status: AC
  Administered 2018-01-06: 80 mg via INTRAVENOUS
  Filled 2018-01-06: qty 80

## 2018-01-06 MED ORDER — ONDANSETRON HCL 4 MG/2ML IJ SOLN: 4.0000 mg | Freq: Four times a day (QID) | INTRAMUSCULAR | Status: DC | PRN

## 2018-01-06 MED ORDER — ACETAMINOPHEN 650 MG RE SUPP: 650.0000 mg | Freq: Four times a day (QID) | RECTAL | Status: DC | PRN

## 2018-01-06 MED ORDER — POTASSIUM CHLORIDE IN NACL 20-0.9 MEQ/L-% IV SOLN
INTRAVENOUS | Status: DC
  Administered 2018-01-06 – 2018-01-07 (×2): via INTRAVENOUS
  Filled 2018-01-06 (×5): qty 1000

## 2018-01-06 MED ORDER — ACETAMINOPHEN 325 MG PO TABS
650.0000 mg | ORAL_TABLET | Freq: Four times a day (QID) | ORAL | Status: DC | PRN
  Administered 2018-01-07: 650 mg via ORAL
  Filled 2018-01-06: qty 2

## 2018-01-06 MED ORDER — SODIUM CHLORIDE 0.9 % IV SOLN
8.0000 mg/h | INTRAVENOUS | Status: DC
  Administered 2018-01-06: 8 mg/h via INTRAVENOUS
  Filled 2018-01-06: qty 80

## 2018-01-06 NOTE — H&P (Addendum)
[facility] [place] [facility]   PATIENT NAME: Joel Mills    MR#:  [id number]  DATE OF BIRTH:  Nov 12, 1980  DATE OF ADMISSION:  01/06/2018  PRIMARY CARE PHYSICIAN: Patient, No Pcp Per   REQUESTING/REFERRING PHYSICIAN:   CHIEF COMPLAINT:   Chief Complaint  Patient presents with  . Abdominal Pain  . Chest Pain  . Shortness of Breath    HISTORY OF PRESENT ILLNESS: Joel Mills  is a 37 y.o. male with no significant past medical history presented to the emergency room with vomiting of blood and black stool.  Patient first noticed black stool Wednesday.  He vomited blood this morning.  Patient was feeling weak and dizzy.  He had increased fatigue and tiredness.  He was evaluated in the emergency room hemoglobin was around 8.  Patient was started on PRBC transfusion and was put on IV Protonix drip.  Gastroenterology was called and hospitalist service was consulted for admission.  Has mild abdominal discomfort in the epigastric area which is aching in nature 4 out of 10 on a scale of 1-10.EKG shows ST depression.   PAST MEDICAL HISTORY: No history of any diabetes, hypertension, coronary disease  PAST SURGICAL HISTORY:  Past Surgical History:  Procedure Laterality Date  . ADENOIDECTOMY      SOCIAL HISTORY:  Social History   Tobacco Use  . Smoking status: Light Tobacco Smoker    Types: Cigarettes  . Smokeless tobacco: Never Used  Substance Use Topics  . Alcohol use: Yes    Comment: socially    FAMILY HISTORY:  Family History  Problem Relation Age of Onset  . Diabetes Mother   . Hypertension Father     DRUG ALLERGIES: No Known Allergies  REVIEW OF SYSTEMS:   CONSTITUTIONAL: No fever, has fatigue and weakness.  EYES: No blurred or double vision.  EARS, NOSE, AND THROAT: No tinnitus or ear pain.  RESPIRATORY: No cough, shortness of breath, wheezing or hemoptysis.  CARDIOVASCULAR: No chest pain, orthopnea, edema.  GASTROINTESTINAL: black  stools Vomited blood has abdominal pain.  GENITOURINARY: No dysuria, hematuria.  ENDOCRINE: No polyuria, nocturia,  HEMATOLOGY: Has anemia, no easy bruising or bleeding SKIN: No rash or lesion. MUSCULOSKELETAL: No joint pain or arthritis.   NEUROLOGIC: No tingling, numbness, weakness.  PSYCHIATRY: No anxiety or depression.   MEDICATIONS AT HOME:  Prior to Admission medications   Medication Sig Start Date End Date Taking? Authorizing Provider  acetaminophen (TYLENOL) 500 MG tablet Take 500-1,000 mg by mouth every 6 (six) hours as needed for mild pain or moderate pain.   Yes [provider]  meloxicam (MOBIC) 15 MG tablet Take 1 tablet (15 mg total) by mouth daily. Patient not taking: Reported on 01/06/2018 12/18/17   Cuthriell, Delorise Royals, PA-C  methocarbamol (ROBAXIN) 500 MG tablet Take 1 tablet (500 mg total) by mouth at bedtime. Patient not taking: Reported on 01/06/2018 12/18/17   Cuthriell, Delorise Royals, PA-C  ondansetron (ZOFRAN ODT) 8 MG disintegrating tablet Take 1 tablet (8 mg total) by mouth every 8 (eight) hours as needed. Patient not taking: Reported on 01/06/2018 12/29/17   Payton Mccallum, MD      PHYSICAL EXAMINATION:   VITAL SIGNS: Blood pressure 116/78, pulse (!) 107, temperature 98.2 F (36.8 C), temperature source Oral, resp. rate 14, SpO2 100 %.  GENERAL:  37 y.o.-year-old patient lying in the bed with no acute distress.  EYES: Pupils equal, round, reactive to light and accommodation. No scleral icterus. Extraocular muscles  intact. Pallor present. HEENT: Head atraumatic, normocephalic. Oropharynx dry and nasopharynx clear.  NECK:  Supple, no jugular venous distention. No thyroid enlargement, no tenderness.  LUNGS: Normal breath sounds bilaterally, no wheezing, rales,rhonchi or crepitation. No use of accessory muscles of respiration.  CARDIOVASCULAR: S1, S2 normal. No murmurs, rubs, or gallops.  ABDOMEN: Soft, mild tenderness in epigastrium, nondistended. Bowel  sounds present. No organomegaly or mass.  EXTREMITIES: No pedal edema, cyanosis, or clubbing.  NEUROLOGIC: Cranial nerves II through XII are intact. Muscle strength 5/5 in all extremities. Sensation intact. Gait not checked.  PSYCHIATRIC: The patient is alert and oriented x 3.  SKIN: No obvious rash, lesion, or ulcer.   LABORATORY PANEL:   CBC Recent Labs  Lab 01/06/18 1056  WBC 16.1*  HGB 8.0*  HCT 22.3*  PLT 237  MCV 89.4  MCH 32.1  MCHC 35.9  RDW 14.1  LYMPHSABS 4.2*  MONOABS 1.3*  EOSABS 0.1  BASOSABS 0.0   ------------------------------------------------------------------------------------------------------------------  Chemistries  Recent Labs  Lab 01/06/18 1056  NA 136  K 3.3*  CL 106  CO2 20*  GLUCOSE 149*  BUN 35*  CREATININE 0.88  CALCIUM 8.4*  AST 21  ALT 14  ALKPHOS 57  BILITOT 0.7   ------------------------------------------------------------------------------------------------------------------ estimated creatinine clearance is 134.9 mL/min (by C-G formula based on SCr of 0.88 mg/dL). ------------------------------------------------------------------------------------------------------------------ No results for input(s): TSH, T4TOTAL, T3FREE, THYROIDAB in the last 72 hours.  Invalid input(s): FREET3   Coagulation profile No results for input(s): INR, PROTIME in the last 168 hours. ------------------------------------------------------------------------------------------------------------------- No results for input(s): DDIMER in the last 72 hours. -------------------------------------------------------------------------------------------------------------------  Cardiac Enzymes Recent Labs  Lab 01/06/18 1056  TROPONINI <0.03   ------------------------------------------------------------------------------------------------------------------ Invalid input(s):  POCBNP  ---------------------------------------------------------------------------------------------------------------  Urinalysis    Component Value Date/Time   COLORURINE YELLOW 12/29/2017 1720   APPEARANCEUR CLEAR 12/29/2017 1720   APPEARANCEUR Clear 02/25/2012 1830   LABSPEC 1.020 12/29/2017 1720   LABSPEC 1.008 02/25/2012 1830   PHURINE 5.5 12/29/2017 1720   GLUCOSEU NEGATIVE 12/29/2017 1720   GLUCOSEU Negative 02/25/2012 1830   HGBUR NEGATIVE 12/29/2017 1720   BILIRUBINUR NEGATIVE 12/29/2017 1720   BILIRUBINUR Negative 02/25/2012 1830   KETONESUR NEGATIVE 12/29/2017 1720   PROTEINUR NEGATIVE 12/29/2017 1720   NITRITE NEGATIVE 12/29/2017 1720   LEUKOCYTESUR NEGATIVE 12/29/2017 1720   LEUKOCYTESUR Negative 02/25/2012 1830     RADIOLOGY: Dg Chest Portable 1 View  Result Date: 01/06/2018 CLINICAL DATA:  Hematemesis. Dizziness and lightheadedness beginning today. EXAM: PORTABLE CHEST 1 VIEW COMPARISON:  11/10/2017 FINDINGS: The heart size and mediastinal contours are within normal limits. Both lungs are clear. The visualized skeletal structures are unremarkable. IMPRESSION: Negative.  No active disease. Electronically Signed   By: Myles Rosenthal M.D.   On: 01/06/2018 11:37    EKG: Orders placed or performed during the hospital encounter of 01/06/18  . EKG 12-Lead  . EKG 12-Lead    IMPRESSION AND PLAN:  37 year old male patient with no significant past medical history presented to the emergency room with vomiting of blood and black stool  -Acute gastrointestinal bleeding Admit patient to medical floor inpatient service PRBC transfusion IV IV fluid hydration N.p.o. for now Serial hemoglobin hematocrit monitoring Gastroenterology consultation IV Protonix drip IV octreotide drip  -Acute hypokalemia Replace potassium intravenously  -Symptomatic anemia PRBC transfusion  -Tobacco abuse Tobacco cessation counseled to the patient for 6 minutes Nicotine patch  offered  -Abnormal EKG Cardiology evaluation  -DVT prophylaxis sequential compression device to lower extremities  All the records are  reviewed and case discussed with ED provider. Management plans discussed with the patient, family and they are in agreement.  CODE STATUS: Full code    TOTAL TIME TAKING CARE OF THIS PATIENT: 52 minutes.    Ihor Austin M.D on 01/06/2018 at 1:36 PM  Between 7am to 6pm - Pager - [phone]  After 6pm go to [email or web address] - password [email or web address]  [facility]  Office  [phone]  CC: Primary care physician; Patient, No Pcp Per

## 2018-01-06 NOTE — Progress Notes (Signed)
   01/06/18 1600  Clinical Encounter Type  Visited With Patient  Visit Type Initial  Referral From Physician  Consult/Referral To Chaplain  Advance Directives (For Healthcare)  Does Patient Have a Medical Advance Directive? No  Would patient like information on creating a medical advance directive? Yes (Inpatient - patient requests chaplain consult to create a medical advance directive)  Mental Health Advance Directives  Does Patient Have a Mental Health Advance Directive? No  Would patient like information on creating a mental health advance directive? No - Patient declined  Received an OR for an (AD) education/completion. Presented to patient's room, he was lying in the bed, with the nurse present, identified myself as the [facility]), and inquired whether the patient has requested information pursuant to an (AD). He acknowledged the request and education ensued. The patient wondered if his wife would want an (AD) as well, and he wanted her to also receive (AD) education. Two (AD) packets were left in the patient room, and CH asked the patient for he and his wife to review the forms and request the [facility] to return for additional education prior to possibly proceeding with completing an (AD) for he and his wife.

## 2018-01-06 NOTE — ED Provider Notes (Signed)
[facility] Emergency Department Provider Note ____________________________________________   I have reviewed the triage vital signs and the triage nursing note.  HISTORY  Chief Complaint Abdominal Pain; Chest Pain; and Shortness of Breath   Historian Patient  HPI Joel Mills is a 37 y.o. male presents with 1 week history of nausea vomiting diarrhea and midepigastric pain, over the last 24 hours he has had several episodes of black clots passing per rectum.  This morning it sounds like he vomited what looked like "coffee-ground"   to his wife.  Mild abdominal discomfort, no significant abdominal discomfort at this point time.  He feels shaky and weak.  No known fevers.  Symptoms are severe.  He saw Dr. 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    History reviewed. No pertinent past medical history.  There are no active problems to display for this patient.   History reviewed. No pertinent surgical history.  Prior to Admission medications   Medication Sig Start Date End Date Taking? Authorizing Provider  acetaminophen (TYLENOL) 500 MG tablet Take 500-1,000 mg by mouth every 6 (six) hours as needed for mild pain or moderate pain.   Yes [provider]  meloxicam (MOBIC) 15 MG tablet Take 1 tablet (15 mg total) by mouth daily. Patient not taking: Reported on 01/06/2018 12/18/17   Cuthriell, Delorise Royals, PA-C  methocarbamol (ROBAXIN) 500 MG tablet Take 1 tablet (500 mg total) by mouth at bedtime. Patient not taking: Reported on 01/06/2018 12/18/17   Cuthriell, Delorise Royals, PA-C  ondansetron (ZOFRAN ODT) 8 MG disintegrating tablet Take 1 tablet (8 mg total) by mouth every 8 (eight) hours as needed. Patient not taking: Reported on 01/06/2018 12/29/17   Payton Mccallum, MD    No Known Allergies  Family History  Problem Relation Age of Onset  . Diabetes Mother   . Hypertension Father     Social History Social  History   Tobacco Use  . Smoking status: Former Smoker    Types: Cigarettes  . Smokeless tobacco: Never Used  Substance Use Topics  . Alcohol use: Yes    Comment: socially  . Drug use: Yes    Types: Marijuana    Review of Systems  Constitutional: Negative for fever. Eyes: Negative for visual changes. ENT: Negative for sore throat. Cardiovascular: Negative for chest pain. Respiratory: Negative for shortness of breath. Gastrointestinal: Positive as per HPI.[name] Genitourinary: Negative for dysuria. Musculoskeletal: Negative for back pain. Skin: Negative for rash. Neurological: Negative for headache.  ____________________________________________   PHYSICAL EXAM:  VITAL SIGNS: ED Triage Vitals  Enc Vitals Group     BP      Pulse      Resp      Temp      Temp src      SpO2      Weight      Height      Head Circumference      Peak Flow      Pain Score      Pain Loc      Pain Edu?      Excl. in GC?      Constitutional: Alert and oriented.  HEENT      Head: Normocephalic and atraumatic.      Eyes: Conjunctivae are normal. Pupils equal and round.       Ears:         Nose: No congestion/rhinnorhea.  Mouth/Throat: Mucous membranes are really dry.  Pale tongue.      Neck: No stridor. Cardiovascular/Chest: Tachycardic rate, regular rhythm.  No murmurs, rubs, or gallops. Respiratory: Normal respiratory effort without tachypnea nor retractions. Breath sounds are clear and equal bilaterally. No wheezes/rales/rhonchi. Gastrointestinal: Soft. No distention, no guarding, no rebound.  Very mild discomfort, nonfocal. Genitourinary/rectal:Deferred Musculoskeletal: Nontender with normal range of motion in all extremities. No joint effusions.  No lower extremity tenderness.  No edema. Neurologic:  Normal speech and language. No gross or focal neurologic deficits are appreciated. Skin:  Skin is warm, dry and intact. No rash noted. Psychiatric: Mood and affect are normal.  Speech and behavior are normal. Patient exhibits appropriate insight and judgment.   ____________________________________________  LABS (pertinent positives/negatives) I, Governor Rooks, MD the attending physician have reviewed the labs noted below.  Labs Reviewed  COMPREHENSIVE METABOLIC PANEL - Abnormal; Notable for the following components:      Result Value   Potassium 3.3 (*)    CO2 20 (*)    Glucose, Bld 149 (*)    BUN 35 (*)    Calcium 8.4 (*)    Total Protein 5.8 (*)    All other components within normal limits  LACTIC ACID, PLASMA - Abnormal; Notable for the following components:   Lactic Acid, Venous 4.4 (*)    All other components within normal limits  CBC WITH DIFFERENTIAL/PLATELET - Abnormal; Notable for the following components:   WBC 16.1 (*)    RBC 2.49 (*)    Hemoglobin 8.0 (*)    HCT 22.3 (*)    Neutro Abs 10.4 (*)    Lymphs Abs 4.2 (*)    Monocytes Absolute 1.3 (*)    All other components within normal limits  LIPASE, BLOOD  TROPONIN I  LACTIC ACID, PLASMA  TYPE AND SCREEN  PREPARE RBC (CROSSMATCH)  ABO/RH    ____________________________________________    EKG I, Governor Rooks, MD, the attending physician have personally viewed and interpreted all ECGs.  165 bpm.  Sinus tachycardia.  Narrow QS.  Normal axis.  ST segment depression inferiorly and laterally. ____________________________________________  RADIOLOGY  Chest x-ray one view portable interpreted by radiologist: Negative __________________________________________  PROCEDURES  Procedure(s) performed: None  Procedures  Critical Care performed: CRITICAL CARE Performed by: Governor Rooks   Total critical care time: 30 minutes  Critical care time was exclusive of separately billable procedures and treating other patients.  Critical care was necessary to treat or prevent imminent or life-threatening deterioration.  Critical care was time spent personally by me on the following  activities: development of treatment plan with patient and/or surrogate as well as nursing, discussions with consultants, evaluation of patient's response to treatment, examination of patient, obtaining history from patient or surrogate, ordering and performing treatments and interventions, ordering and review of laboratory studies, ordering and review of radiographic studies, pulse oximetry and re-evaluation of patient's condition.    ____________________________________________  ED COURSE / ASSESSMENT AND PLAN  Pertinent labs & imaging results that were available during my care of the patient were reviewed by me and considered in my medical decision making (see chart for details).    Patient arrived significantly tachycardic and showing pictures of what looks to be coagulated blood per rectum/black/very dark.  2 large-bore IVs were started and heart rate coming down from 1 16-1 20 after first 500 cc.  No hypotension.  Patient given nausea medication.  Started Protonix out of concern for potential upper GI bleed.  Unclear etiology  whether not this is a lower GI bleed upper GI bleed, but I suspect more upper given how dark the stool and clots per rectum are.  Hemoglobin came back at 8, prior 15.  Given his recent bleeding, I suspect he may be even lower than this.  We discussed recognition for blood transition and patient consented and agreeable.  After 2 L normal saline, heart rate down to about 100 and continues to be normotensive.  I discussed with gastroenterology who will consult, hospitalist for admission.      CONSULTATIONS:   Dr. Norma Fredrickson, gastroenterology will see patient in the hospital.  Hospitalist for admission.   Patient / Family / Caregiver informed of clinical course, medical decision-making process, and agree with plan.     ___________________________________________   FINAL CLINICAL IMPRESSION(S) / ED DIAGNOSES   Final diagnoses:  Acute upper GI bleeding       ___________________________________________         Note: This dictation was prepared with Dragon dictation. Any transcriptional errors that result from this process are unintentional    Governor Rooks, MD 01/06/18 1230

## 2018-01-06 NOTE — Progress Notes (Signed)
Advanced care plan.  Purpose of the Encounter: CODE STATUS  Parties in Attendance: Patient  Patient's Decision Capacity: Good  Subjective/Patient's story: Presented to the emergency room for vomiting of blood and black stool   Objective/Medical story Patient has gastrointestinal bleeding and symptomatic anemia Needs PRBC transfusion and gastroenterology work-up for GI bleed   Goals of care determination:  Advance care directives and goals of care discussed with the patient And plan discussed with the patient Patient wants everything done which includes CPR, intubation and ventilator if the need arises   CODE STATUS: Full code   Time spent discussing advanced care planning: 16 minutes

## 2018-01-06 NOTE — Plan of Care (Signed)

## 2018-01-06 NOTE — ED Triage Notes (Signed)
abd pain, vomiting, dark black stools on Wednesday and has cont, now seeing pink colored blood in toilet, pt is pale, breathing fast in triage, light headed and dizzy

## 2018-01-06 NOTE — Consult Note (Signed)
[facility] GI Inpatient Consult Note   Joel Mills, M.D.  Reason for Consult: Upper gastrointestinal bleed   Attending Requesting Consult: Erline Levine, M.D.   History of Present Illness: Joel Mills is a 37 y.o. male with history of chronic back pain and sciatica taking anti-inflammatories for several months.  Patient has complained of weeklong history of epigastric and periumbilical discomfort leading to persistent nausea and passage of "dark stools" upwards of 5 days ago.  The patient saw his family physician who recommended a low residue diet for possible viral gastroenteritis.  Patient had short-term improvement on this, however, resumed having nausea culminating in a coffee-ground type emesis yesterday.  He  presented to the emergency room was noted to have a heart rate in the 160s which normalized with brisk IV hydration.  Currently the patient has mild periumbilical discomfort but no nausea after antiemetic therapy was administered. The patient is on IV Protonix drip and is receiving packed red blood cell transfusion for posthemorrhagic anemia.  Past Medical History:  History reviewed. No pertinent past medical history.  Problem List: Patient Active Problem List   Diagnosis Date Noted  . GI bleed 01/06/2018    Past Surgical History: Past Surgical History:  Procedure Laterality Date  . ADENOIDECTOMY      Allergies: No Known Allergies  Home Medications: Medications Prior to Admission  Medication Sig Dispense Refill Last Dose  . acetaminophen (TYLENOL) 500 MG tablet Take 500-1,000 mg by mouth every 6 (six) hours as needed for mild pain or moderate pain.   Past Week at PRN  . meloxicam (MOBIC) 15 MG tablet Take 1 tablet (15 mg total) by mouth daily. (Patient not taking: Reported on 01/06/2018) 30 tablet 1 Not Taking at Unknown time  . methocarbamol (ROBAXIN) 500 MG tablet Take 1 tablet (500 mg total) by mouth at bedtime. (Patient not taking: Reported on 01/06/2018) 15  tablet 1 Not Taking at Unknown time  . ondansetron (ZOFRAN ODT) 8 MG disintegrating tablet Take 1 tablet (8 mg total) by mouth every 8 (eight) hours as needed. (Patient not taking: Reported on 01/06/2018) 6 tablet 0 Not Taking at Unknown time   Home medication reconciliation was completed with the patient.   Scheduled Inpatient Medications:   . nicotine  14 mg Transdermal Daily  . [START ON 01/09/2018] pantoprazole  40 mg Intravenous Q12H    Continuous Inpatient Infusions:   . sodium chloride Stopped (01/06/18 1432)  . 0.9 % NaCl with KCl 20 mEq / L    . octreotide  (SANDOSTATIN)    IV infusion    . pantoprozole (PROTONIX) infusion      PRN Inpatient Medications:  acetaminophen **OR** acetaminophen, ondansetron **OR** ondansetron (ZOFRAN) IV  Family History: family history includes Diabetes in his mother; Hypertension in his father.   GI Family History: Negative  Social History:   reports that he has been smoking cigarettes. He has never used smokeless tobacco. He reports that he drinks alcohol. He reports that he has current or past drug history. Drug: Marijuana. The patient denies ETOH, tobacco, or drug use.    Review of Systems: Review of Systems - Negative except That in the history of present illness.  Physical Examination: BP 121/69 (BP Location: Right Arm)   Pulse 98   Temp 98.9 F (37.2 C) (Oral)   Resp 19   Ht 6\' 3"  (1.905 m)   Wt 82.1 kg   SpO2 97%   BMI 22.62 kg/m  Physical Exam  Constitutional: He  appears well-developed and well-nourished. He appears ill.  HENT:  Head: Normocephalic and atraumatic.  Mouth/Throat: Oropharynx is clear and moist.  Eyes: Pupils are equal, round, and reactive to light.  Cardiovascular: Normal rate and normal heart sounds.  Pulmonary/Chest: Effort normal and breath sounds normal.  Abdominal: Normal appearance, normal aorta and bowel sounds are normal. He exhibits no distension and no ascites. There is tenderness. There is no CVA  tenderness. No hernia.  Skin: Skin is dry. No rash noted. He is not diaphoretic. There is pallor.  Vitals reviewed.   Data: Lab Results  Component Value Date   WBC 16.1 (H) 01/06/2018   HGB 8.0 (L) 01/06/2018   HCT 22.3 (L) 01/06/2018   MCV 89.4 01/06/2018   PLT 237 01/06/2018   Recent Labs  Lab 01/06/18 1056  HGB 8.0*   Lab Results  Component Value Date   NA 136 01/06/2018   K 3.3 (L) 01/06/2018   CL 106 01/06/2018   CO2 20 (L) 01/06/2018   BUN 35 (H) 01/06/2018   CREATININE 0.88 01/06/2018   Lab Results  Component Value Date   ALT 14 01/06/2018   AST 21 01/06/2018   ALKPHOS 57 01/06/2018   BILITOT 0.7 01/06/2018   No results for input(s): APTT, INR, PTT in the last 168 hours. CBC Latest Ref Rng & Units 01/06/2018 12/29/2017 11/10/2017  WBC 3.8 - 10.6 K/uL 16.1(H) 8.3 7.7  Hemoglobin 13.0 - 18.0 g/dL 8.0(L) 15.5 15.8  Hematocrit 40.0 - 52.0 % 22.3(L) 44.9 44.3  Platelets 150 - 440 K/uL 237 193 188    STUDIES: Dg Chest Portable 1 View  Result Date: 01/06/2018 CLINICAL DATA:  Hematemesis. Dizziness and lightheadedness beginning today. EXAM: PORTABLE CHEST 1 VIEW COMPARISON:  11/10/2017 FINDINGS: The heart size and mediastinal contours are within normal limits. Both lungs are clear. The visualized skeletal structures are unremarkable. IMPRESSION: Negative.  No active disease. Electronically Signed   By: Myles Rosenthal M.D.   On: 01/06/2018 11:37   @IMAGES @  Assessment:   1.  Acute posthemorrhagic anemia- secondary to GI blood loss.  Receiving packed red blood cells transfusion.   2.  Melena/hematemesis-likely upper GI source.  Most likely peptic ulcer disease, though malignancy should be ruled out along with other less common causes.  3.  Abdominal pain  Recommendations: 1.  Agree with IV Protonix, symptomatic therapy of nausea. Transfusions as needed from a hemodynamic standpoint. 2.  We will advance to clear liquid diet for now given relative stability after IV  resuscitation. 3.EGD in am. The patient understands the nature of the planned procedure, indications, risks, alternatives and potential complications including but not limited to bleeding, infection, perforation, damage to internal organs and possible oversedation/side effects from anesthesia. The patient agrees and gives consent to proceed.  Please refer to procedure notes for findings, recommendations and patient disposition/instructions.  Thank you for the consult. Please call with questions or concerns.  Rosina Lowenstein, "Mellody Dance MD [facility] Gastroenterology [address] [place], [place] [address] [phone]  01/06/2018 2:54 PM

## 2018-01-07 ENCOUNTER — Inpatient Hospital Stay: Payer: Medicaid Other | Admitting: Anesthesiology

## 2018-01-07 ENCOUNTER — Encounter
Admission: EM | Disposition: A | Discharge status: Home / Self Care | Payer: Self-pay | Source: Home / Self Care | Attending: Internal Medicine

## 2018-01-07 HISTORY — PX: ESOPHAGOGASTRODUODENOSCOPY (EGD) WITH PROPOFOL: SHX5813

## 2018-01-07 LAB — BPAM RBC
BLOOD PRODUCT EXPIRATION DATE: 201910042359
Blood Product Expiration Date: 201910042359
ISSUE DATE / TIME: 201909071217
ISSUE DATE / TIME: 201909071645
UNIT TYPE AND RH: 6200
Unit Type and Rh: 6200

## 2018-01-07 LAB — TYPE AND SCREEN
ABO/RH(D): A POS
ANTIBODY SCREEN: NEGATIVE
UNIT DIVISION: 0
UNIT DIVISION: 0

## 2018-01-07 LAB — HEMOGLOBIN AND HEMATOCRIT, BLOOD
HCT: 24.6 % — ABNORMAL LOW (ref 40.0–52.0)
HEMATOCRIT: 25 % — AB (ref 40.0–52.0)
HEMATOCRIT: 28.2 % — AB (ref 40.0–52.0)
HEMOGLOBIN: 8.6 g/dL — AB (ref 13.0–18.0)
HEMOGLOBIN: 8.9 g/dL — AB (ref 13.0–18.0)
Hemoglobin: 10.1 g/dL — ABNORMAL LOW (ref 13.0–18.0)

## 2018-01-07 LAB — BASIC METABOLIC PANEL
Anion gap: 4 — ABNORMAL LOW (ref 5–15)
BUN: 15 mg/dL (ref 6–20)
CHLORIDE: 109 mmol/L (ref 98–111)
CO2: 27 mmol/L (ref 22–32)
Calcium: 7.9 mg/dL — ABNORMAL LOW (ref 8.9–10.3)
Creatinine, Ser: 1.06 mg/dL (ref 0.61–1.24)
GFR calc Af Amer: >60 mL/min (ref >60)
GFR calc non Af Amer: >60 mL/min (ref >60)
Glucose, Bld: 118 mg/dL — ABNORMAL HIGH (ref 70–99)
POTASSIUM: 4.2 mmol/L (ref 3.5–5.1)
SODIUM: 140 mmol/L (ref 135–145)

## 2018-01-07 LAB — TROPONIN I
Troponin I: <0.03 ng/mL (ref <0.03)
Troponin I: <0.03 ng/mL (ref <0.03)

## 2018-01-07 SURGERY — ESOPHAGOGASTRODUODENOSCOPY (EGD) WITH PROPOFOL
Anesthesia: General

## 2018-01-07 MED ORDER — PROPOFOL 500 MG/50ML IV EMUL
INTRAVENOUS | Status: DC | PRN
  Administered 2018-01-07: 150 ug/kg/min via INTRAVENOUS

## 2018-01-07 MED ORDER — ONDANSETRON HCL 4 MG/2ML IJ SOLN
INTRAMUSCULAR | Status: AC
  Filled 2018-01-07: qty 2

## 2018-01-07 MED ORDER — PROPOFOL 10 MG/ML IV BOLUS
INTRAVENOUS | Status: DC | PRN
  Administered 2018-01-07: 60 mg via INTRAVENOUS
  Administered 2018-01-07: 40 mg via INTRAVENOUS

## 2018-01-07 MED ORDER — ONDANSETRON HCL 4 MG/2ML IJ SOLN
4.0000 mg | Freq: Once | INTRAMUSCULAR | Status: AC | PRN
  Administered 2018-01-07: 4 mg via INTRAVENOUS

## 2018-01-07 MED ORDER — FENTANYL CITRATE (PF) 100 MCG/2ML IJ SOLN
25.0000 ug | INTRAMUSCULAR | Status: DC | PRN
  Administered 2018-01-07 (×2): 25 ug via INTRAVENOUS

## 2018-01-07 MED ORDER — LIDOCAINE HCL (PF) 2 % IJ SOLN
INTRAMUSCULAR | Status: DC | PRN
  Administered 2018-01-07: 100 mg via INTRADERMAL

## 2018-01-07 MED ORDER — FENTANYL CITRATE (PF) 100 MCG/2ML IJ SOLN
INTRAMUSCULAR | Status: AC
  Filled 2018-01-07: qty 2

## 2018-01-07 MED ORDER — PANTOPRAZOLE SODIUM 40 MG PO TBEC: 40.0000 mg | DELAYED_RELEASE_TABLET | Freq: Two times a day (BID) | ORAL | 0 refills | Status: DC

## 2018-01-07 MED ORDER — FERROUS SULFATE 325 (65 FE) MG PO TABS: 325.0000 mg | ORAL_TABLET | Freq: Two times a day (BID) | ORAL | 0 refills | Status: DC

## 2018-01-07 MED ORDER — PROPOFOL 10 MG/ML IV BOLUS
INTRAVENOUS | Status: AC
  Filled 2018-01-07: qty 40

## 2018-01-07 MED ORDER — SODIUM CHLORIDE 0.9 % IV SOLN: INTRAVENOUS | Status: DC

## 2018-01-07 NOTE — Anesthesia Preprocedure Evaluation (Signed)
Anesthesia Evaluation  Patient identified by MRN, date of birth, ID band Patient awake    Reviewed: Allergy & Precautions, NPO status , Patient's Chart, lab work & pertinent test results, reviewed documented beta blocker date and time   Airway Mallampati: II  TM Distance: >3 FB     Dental  (+) Chipped   Pulmonary Current Smoker,           Cardiovascular      Neuro/Psych    GI/Hepatic   Endo/Other    Renal/GU      Musculoskeletal   Abdominal   Peds  Hematology   Anesthesia Other Findings Abnormal EKG. Cardiac consult requested. He does not feel this procedure needs to be postponed.  Reproductive/Obstetrics                             Anesthesia Physical Anesthesia Plan  ASA: II  Anesthesia Plan: General   Post-op Pain Management:    Induction: Intravenous  PONV Risk Score and Plan:   Airway Management Planned:   Additional Equipment:   Intra-op Plan:   Post-operative Plan:   Informed Consent: I have reviewed the patients History and Physical, chart, labs and discussed the procedure including the risks, benefits and alternatives for the proposed anesthesia with the patient or authorized representative who has indicated his/her understanding and acceptance.     Plan Discussed with: CRNA  Anesthesia Plan Comments:         Anesthesia Quick Evaluation

## 2018-01-07 NOTE — Anesthesia Postprocedure Evaluation (Signed)
Anesthesia Post Note  Patient: Joel Mills  Procedure(s) Performed: ESOPHAGOGASTRODUODENOSCOPY (EGD) WITH PROPOFOL (N/A )  Patient location during evaluation: Endoscopy Anesthesia Type: General Level of consciousness: awake and alert Pain management: pain level controlled Vital Signs Assessment: post-procedure vital signs reviewed and stable Respiratory status: spontaneous breathing, nonlabored ventilation, respiratory function stable and patient connected to nasal cannula oxygen Cardiovascular status: blood pressure returned to baseline and stable Postop Assessment: no apparent nausea or vomiting Anesthetic complications: no     Last Vitals:  Vitals:   01/07/18 1113 01/07/18 1118  BP: 116/74   Pulse: 78   Resp: 11 17  Temp:    SpO2: 100%     Last Pain:  Vitals:   01/07/18 1113  TempSrc:   PainSc: 3                  Ivo Moga S

## 2018-01-07 NOTE — Anesthesia Post-op Follow-up Note (Signed)
Anesthesia QCDR form completed.        

## 2018-01-07 NOTE — Progress Notes (Signed)
Discussed with cardiology Dr. Darrold Junker .  Advised to proceed with EGD.  EKG changes are thought to be due to rate related with severe anemia.  No chest pain and negative troponin.  No further work-up needed.

## 2018-01-07 NOTE — Anesthesia Procedure Notes (Signed)
Date/Time: 01/07/2018 10:24 AM Performed by: Junious Silk, CRNA Pre-anesthesia Checklist: Patient identified, Emergency Drugs available, Suction available, Patient being monitored and Timeout performed Oxygen Delivery Method: Nasal cannula

## 2018-01-07 NOTE — Transfer of Care (Signed)
Immediate Anesthesia Transfer of Care Note  Patient: Joel Mills  Procedure(s) Performed: ESOPHAGOGASTRODUODENOSCOPY (EGD) WITH PROPOFOL (N/A )  Patient Location: PACU  Anesthesia Type:General  Level of Consciousness: oriented and sedated  Airway & Oxygen Therapy: Patient Spontanous Breathing and Patient connected to nasal cannula oxygen  Post-op Assessment: Report given to RN and Post -op Vital signs reviewed and stable  Post vital signs: Reviewed and stable  Last Vitals:  Vitals Value Taken Time  BP 111/77 01/07/2018 10:34 AM  Temp 36.4 C 01/07/2018 10:33 AM  Pulse 82 01/07/2018 10:41 AM  Resp 20 01/07/2018 10:41 AM  SpO2 100 % 01/07/2018 10:41 AM  Vitals shown include unvalidated device data.  Last Pain:  Vitals:   01/07/18 1033  TempSrc:   PainSc: Asleep      Patients Stated Pain Goal: 0 (01/06/18 2000)  Complications: No apparent anesthesia complications

## 2018-01-07 NOTE — Consult Note (Signed)
[facility] Cardiology  CARDIOLOGY CONSULT NOTE  Patient ID: Joel Mills MRN: [id number] DOB/AGE: 1981/04/20 37 y.o.  Admit date: 01/06/2018 Referring Physician [facility] Primary Physician [facility] Primary Cardiologist  Reason for Consultation abnormal ECG  HPI: 59 year old gentleman referred for evaluation of abnormal ECG.  The patient presents with one-week history of epigastric and periumbilical discomfort, with nausea, dark stools.  Urgency room, patient noted to be tachycardic, with ECG revealing sinus tachycardia rate of 165 with nonspecific inferolateral ST abnormalities.  Admission labs notable for hemoglobin hematocrit of 8.0 and 22.3, respectively.  The patient denies chest pain.  Troponin is less than 0.032.  Heart rate has normalized after infusion and intravenous fluid administration.  Review of systems complete and found to be negative unless listed above     History reviewed. No pertinent past medical history.  Past Surgical History:  Procedure Laterality Date  . ADENOIDECTOMY      Medications Prior to Admission  Medication Sig Dispense Refill Last Dose  . acetaminophen (TYLENOL) 500 MG tablet Take 500-1,000 mg by mouth every 6 (six) hours as needed for mild pain or moderate pain.   Past Week at PRN  . meloxicam (MOBIC) 15 MG tablet Take 1 tablet (15 mg total) by mouth daily. (Patient not taking: Reported on 01/06/2018) 30 tablet 1 Not Taking at Unknown time  . methocarbamol (ROBAXIN) 500 MG tablet Take 1 tablet (500 mg total) by mouth at bedtime. (Patient not taking: Reported on 01/06/2018) 15 tablet 1 Not Taking at Unknown time  . ondansetron (ZOFRAN ODT) 8 MG disintegrating tablet Take 1 tablet (8 mg total) by mouth every 8 (eight) hours as needed. (Patient not taking: Reported on 01/06/2018) 6 tablet 0 Not Taking at Unknown time   Social History   Socioeconomic History  . Marital status: Married    Spouse name: Not on file  . Number of children: Not on file  . Years of education: Not  on file  . Highest education level: Not on file  Occupational History  . Occupation: Corporate investment banker  Social Needs  . Financial resource strain: Not on file  . Food insecurity:    Worry: Not on file    Inability: Not on file  . Transportation needs:    Medical: Not on file    Non-medical: Not on file  Tobacco Use  . Smoking status: Light Tobacco Smoker    Types: Cigarettes  . Smokeless tobacco: Never Used  Substance and Sexual Activity  . Alcohol use: Yes    Comment: socially  . Drug use: Yes    Types: Marijuana  . Sexual activity: Not on file  Lifestyle  . Physical activity:    Days per week: Not on file    Minutes per session: Not on file  . Stress: Not on file  Relationships  . Social connections:    Talks on phone: Not on file    Gets together: Not on file    Attends religious service: Not on file    Active member of club or organization: Not on file    Attends meetings of clubs or organizations: Not on file    Relationship status: Not on file  . Intimate partner violence:    Fear of current or ex partner: Not on file    Emotionally abused: Not on file    Physically abused: Not on file    Forced sexual activity: Not on file  Other Topics Concern  . Not on file  Social History Narrative  .  Not on file    Family History  Problem Relation Age of Onset  . Diabetes Mother   . Hypertension Father       Review of systems complete and found to be negative unless listed above      PHYSICAL EXAM  General: Well developed, well nourished, in no acute distress HEENT:  Normocephalic and atramatic Neck:  No JVD.  Lungs: Clear bilaterally to auscultation and percussion. Heart: HRRR . Normal S1 and S2 without gallops or murmurs.  Abdomen: Bowel sounds are positive, abdomen soft and non-tender  Msk:  Back normal, normal gait. Normal strength and tone for age. Extremities: No clubbing, cyanosis or edema.   Neuro: Alert and oriented X 3. Psych:  Good affect,  responds appropriately  Labs:   Lab Results  Component Value Date   WBC 16.1 (H) 01/06/2018   HGB 8.9 (L) 01/07/2018   HCT 25.0 (L) 01/07/2018   MCV 89.4 01/06/2018   PLT 237 01/06/2018    Recent Labs  Lab 01/06/18 1056 01/07/18 0631  NA 136 140  K 3.3* 4.2  CL 106 109  CO2 20* 27  BUN 35* 15  CREATININE 0.88 1.06  CALCIUM 8.4* 7.9*  PROT 5.8*  --   BILITOT 0.7  --   ALKPHOS 57  --   ALT 14  --   AST 21  --   GLUCOSE 149* 118*   Lab Results  Component Value Date   CKTOTAL 796 (H) 10/24/2011   CKMB 2.3 10/24/2011   TROPONINI <0.03 01/07/2018   No results found for: CHOL No results found for: HDL No results found for: LDLCALC No results found for: TRIG No results found for: CHOLHDL No results found for: LDLDIRECT    Radiology: Dg Lumbar Spine Complete  Result Date: 12/18/2017 CLINICAL DATA:  Low back pain radiating down the left leg. EXAM: LUMBAR SPINE - COMPLETE 4+ VIEW COMPARISON:  None. FINDINGS: Five lumbar type vertebral bodies show minimal thoracolumbar curvature convex to the left and lower lumbar curvature convex to the right. No antero or retrolisthesis. No disc space narrowing. No facet arthropathy or pars defect. IMPRESSION: Minimal spinal curvature, likely subclinical. No disc or joint degenerative changes. Electronically Signed   By: Paulina Fusi M.D.   On: 12/18/2017 16:52   Dg Chest Portable 1 View  Result Date: 01/06/2018 CLINICAL DATA:  Hematemesis. Dizziness and lightheadedness beginning today. EXAM: PORTABLE CHEST 1 VIEW COMPARISON:  11/10/2017 FINDINGS: The heart size and mediastinal contours are within normal limits. Both lungs are clear. The visualized skeletal structures are unremarkable. IMPRESSION: Negative.  No active disease. Electronically Signed   By: Myles Rosenthal M.D.   On: 01/06/2018 11:37   Dg Abd 2 Views  Result Date: 12/29/2017 CLINICAL DATA:  Epigastric pain, vomiting and diarrhea in EXAM: ABDOMEN - 2 VIEW COMPARISON:  None. FINDINGS:  The bowel gas pattern is normal. There is no evidence of free air. No radio-opaque calculi or other significant radiographic abnormality is seen. IMPRESSION: Negative. Electronically Signed   By: Deatra Robinson M.D.   On: 12/29/2017 18:26    EKG: Sinus tachycardia with inferolateral nonspecific ST abnormalities  ASSESSMENT AND PLAN:   1.  Abnormal ECG, with sinus tachycardia, inferolateral nonspecific ST abnormalities, likely rate related, in the setting of severe anemia and GI bleed, in the absence of chest pain, with negative troponin.  The patient should be at low an acceptable risk for serious cardiovascular complication during endoscopy.  Recommendations  1.  Agree  with current therapy 2.  Proceed with endoscopy as planned 3.  Defer further cardiac diagnostics at this time  Sign off for now, please call if any questions  Signed: Marcina Millard MD,PhD, [facility] 01/07/2018, 8:32 AM

## 2018-01-07 NOTE — Op Note (Signed)
[facility] Gastroenterology Patient Name: Joel Mills Procedure Date: 01/07/2018 10:09 AM MRN: [id number] Account #: [id number] Date of Birth: Feb 20, 1981 Admit Type: Outpatient Age: 37 Room: [facility] ENDO ROOM 3 Gender: Male Note Status: Finalized Procedure:            Upper GI endoscopy Indications:          Suspected upper gastrointestinal bleeding, Epigastric                        abdominal pain, Melena Providers:            Boykin Nearing. Toledo MD, MD Medicines:            Propofol per Anesthesia Complications:        No immediate complications. Procedure:            Pre-Anesthesia Assessment:                       - The risks and benefits of the procedure and the                        sedation options and risks were discussed with the                        patient. All questions were answered and informed                        consent was obtained.                       - Patient identification and proposed procedure were                        verified prior to the procedure by the nurse. The                        procedure was verified in the procedure room.                       - ASA Grade Assessment: I - A normal, healthy patient.                       - After reviewing the risks and benefits, the patient                        was deemed in satisfactory condition to undergo the                        procedure.                       After obtaining informed consent, the endoscope was                        passed under direct vision. Throughout the procedure,                        the patient's blood pressure, pulse, and oxygen                        saturations were monitored continuously. The Endoscope  was introduced through the mouth, and advanced to the                        third part of duodenum. The upper GI endoscopy was                        accomplished without difficulty. The patient tolerated   the procedure well. Findings:      The Z-line was irregular and was found at the gastroesophageal junction.       Mucosa was biopsied with a cold forceps for histology. One specimen       bottle was sent to pathology.      Patchy mildly erythematous mucosa without bleeding was found in the       gastric antrum. Biopsies were taken with a cold forceps for Helicobacter       pylori testing.      The cardia and gastric fundus were normal on retroflexion.      One non-bleeding cratered duodenal ulcer with no stigmata of bleeding       was found in the duodenal bulb. The lesion was 12 mm in largest       dimension.      A 3 mm healed ulcer was found in the first portion of the duodenum. Impression:           - Z-line irregular, at the gastroesophageal junction.                        Biopsied.                       - Erythematous mucosa in the antrum. Biopsied.                       - One non-bleeding duodenal ulcer with no stigmata of                        bleeding.                       - Duodenal scar. Recommendation:       - Await pathology results.                       - Return patient to hospital ward for observation.                       - Full liquid diet - advance as tolerated to resume                        regular diet.                       - Use Protonix (pantoprazole) 40 mg PO BID.                       - Return to my office in 1 month.                       - The findings and recommendations were discussed with                        the patient. Procedure Code(s):    ---  Professional ---                       [phone], Esophagogastroduodenoscopy, flexible, transoral;                        with biopsy, single or multiple Diagnosis Code(s):    --- Professional ---                       K92.1, Melena (includes Hematochezia)                       R10.13, Epigastric pain                       K26.9, Duodenal ulcer, unspecified as acute or chronic,                         without hemorrhage or perforation                       K31.89, Other diseases of stomach and duodenum                       K22.8, Other specified diseases of esophagus CPT copyright 2017 American Medical Association. All rights reserved. The codes documented in this report are preliminary and upon coder review may  be revised to meet current compliance requirements. Stanton Kidney MD, MD 01/07/2018 10:43:29 AM This report has been signed electronically. Number of Addenda: 0 Note Initiated On: 01/07/2018 10:09 AM      [facility]

## 2018-01-08 ENCOUNTER — Encounter: Payer: Self-pay | Admitting: Internal Medicine

## 2018-01-08 LAB — HIV ANTIBODY (ROUTINE TESTING W REFLEX): HIV SCREEN 4TH GENERATION: NONREACTIVE

## 2018-01-09 LAB — SURGICAL PATHOLOGY

## 2018-01-09 NOTE — Discharge Summary (Signed)
[facility] [place] [facility]   PATIENT NAME: Joel Mills    MR#:  [id number]  DATE OF BIRTH:  [birth year]  DATE OF ADMISSION:  01/06/2018 ADMITTING PHYSICIAN: Ihor Austin, MD  DATE OF DISCHARGE: 01/07/2018  6:12 PM  PRIMARY CARE PHYSICIAN: Patient, No Pcp Per   ADMISSION DIAGNOSIS:  Acute upper GI bleeding [K92.2]  DISCHARGE DIAGNOSIS:  Active Problems:   GI bleed   SECONDARY DIAGNOSIS:  History reviewed. No pertinent past medical history.   ADMITTING HISTORY  HISTORY OF PRESENT ILLNESS: Joel Mills  is a 37 y.o. male with no significant past medical history presented to the emergency room with vomiting of blood and black stool.  Patient first noticed black stool Wednesday.  He vomited blood this morning.  Patient was feeling weak and dizzy.  He had increased fatigue and tiredness.  He was evaluated in the emergency room hemoglobin was around 8.  Patient was started on PRBC transfusion and was put on IV Protonix drip.  Gastroenterology was called and hospitalist service was consulted for admission.  Has mild abdominal discomfort in the epigastric area which is aching in nature 4 out of 10 on a scale of 1-10.EKG shows ST depression.   HOSPITAL COURSE:   *Upper GI bleed with acute blood loss anemia.  Patient was admitted to medical floor and started on IV Protonix.  Hemoglobin was monitored and remained stable by the day of discharge.  Patient had EGD which showed single nonbleeding duodenal ulcer.  Also erythematous mucosa in the antrum.  This was due to NSAIDs patient has been using.  Recommended to stop.  Started on Protonix.  Liquid diet and advanced as tolerated.  Discharged home in stable condition to follow-up with primary care physician and Dr. Norma Fredrickson.  CONSULTS OBTAINED:  Treatment Team:  Stanton Kidney, MD Milagros Loll, MD  DRUG ALLERGIES:  No Known Allergies  DISCHARGE MEDICATIONS:   Allergies as of 01/07/2018   No Known Allergies      Medication List    STOP taking these medications   meloxicam 15 MG tablet Commonly known as:  MOBIC     TAKE these medications   acetaminophen 500 MG tablet Commonly known as:  TYLENOL Take 500-1,000 mg by mouth every 6 (six) hours as needed for mild pain or moderate pain.   ferrous sulfate 325 (65 FE) MG tablet Take 1 tablet (325 mg total) by mouth 2 (two) times daily with a meal.   methocarbamol 500 MG tablet Commonly known as:  ROBAXIN Take 1 tablet (500 mg total) by mouth at bedtime.   ondansetron 8 MG disintegrating tablet Commonly known as:  ZOFRAN-ODT Take 1 tablet (8 mg total) by mouth every 8 (eight) hours as needed.   pantoprazole 40 MG tablet Commonly known as:  PROTONIX Take 1 tablet (40 mg total) by mouth 2 (two) times daily before a meal.       Today   VITAL SIGNS:  Blood pressure 121/68, pulse 75, temperature 98.2 F (36.8 C), temperature source Oral, resp. rate 17, height 6\' 3"  (1.905 m), weight 82.1 kg, SpO2 98 %.  I/O:  No intake or output data in the 24 hours ending 01/09/18 1713  PHYSICAL EXAMINATION:  Physical Exam  GENERAL:  37 y.o.-year-old patient lying in the bed with no acute distress.  LUNGS: Normal breath sounds bilaterally, no wheezing, rales,rhonchi or crepitation. No use of accessory muscles of respiration.  CARDIOVASCULAR: S1, S2 normal. No murmurs, rubs, or gallops.  ABDOMEN: Soft, non-tender, non-distended. Bowel sounds present. No organomegaly or mass.  NEUROLOGIC: Moves all 4 extremities. PSYCHIATRIC: The patient is alert and oriented x 3.  SKIN: No obvious rash, lesion, or ulcer.   DATA REVIEW:   CBC Recent Labs  Lab 01/06/18 1056  01/07/18 1327  WBC 16.1*  --   --   HGB 8.0*   < > 10.1*  HCT 22.3*   < > 28.2*  PLT 237  --   --    < > = values in this interval not displayed.    Chemistries  Recent Labs  Lab 01/06/18 1056 01/07/18 0631  NA 136 140  K 3.3* 4.2  CL 106 109  CO2 20* 27  GLUCOSE 149* 118*  BUN  35* 15  CREATININE 0.88 1.06  CALCIUM 8.4* 7.9*  AST 21  --   ALT 14  --   ALKPHOS 57  --   BILITOT 0.7  --     Cardiac Enzymes Recent Labs  Lab 01/07/18 1327  TROPONINI <0.03    Microbiology Results  No results found for this or any previous visit.  RADIOLOGY:  No results found.  Follow up with PCP in 1 week.  Management plans discussed with the patient, family and they are in agreement.  CODE STATUS:  Code Status History    Date Active Date Inactive Code Status Order ID Comments User Context   01/06/2018 1353 01/07/2018 2118 Full Code [id number]  Ihor Austin, MD Inpatient      TOTAL TIME TAKING CARE OF THIS PATIENT ON DAY OF DISCHARGE: more than 30 minutes.   Molinda Bailiff Juanitta Earnhardt M.D on 01/09/2018 at 5:13 PM  Between 7am to 6pm - Pager - [phone]  After 6pm go to [email or web address] - password [email or web address]  [facility] [place] [facility]  Office  [phone]  CC: Primary care physician; Patient, No Pcp Per  Note: This dictation was prepared with Dragon dictation along with smaller phrase technology. Any transcriptional errors that result from this process are unintentional.

## 2018-01-11 ENCOUNTER — Telehealth: Payer: Self-pay

## 2018-01-11 NOTE — Telephone Encounter (Signed)
Flagged on EMMI report for not having a follow up appointment and for not reading discharge papers.  First attempt to reach made 01/11/18 at 1:29pm, however unable to reach.  Left voicemail encouraging callback.  Will attempt at later time.

## 2018-01-12 NOTE — Telephone Encounter (Signed)
EMMI Follow-up: Second attempt to reach patient regarding discharge paperwork and follow-up appointment. I talked with Mr. Joel Mills and he does not have a PCP so I provided him with the number for the Physician Referral Service and he thanked me.  We wanted to know his results of the biopsy and I let him know we would need to talk with the MD. York SpanielSaid he was recovering very well.  I let him know there would be a second automated call with a different series of questions and to let us know if he had any other concerns at that time.

## 2018-03-20 ENCOUNTER — Ambulatory Visit
Admission: EM | Admit: 2018-03-20 | Discharge: 2018-03-20 | Disposition: A | Discharge status: Home / Self Care | Payer: MEDICAID | Attending: Family Medicine | Admitting: Family Medicine

## 2018-03-20 ENCOUNTER — Encounter: Payer: Self-pay | Admitting: Emergency Medicine

## 2018-03-20 ENCOUNTER — Other Ambulatory Visit: Payer: Self-pay

## 2018-03-20 DIAGNOSIS — H6983 Other specified disorders of Eustachian tube, bilateral: Secondary | ICD-10-CM

## 2018-03-20 MED ORDER — FLUTICASONE PROPIONATE 50 MCG/ACT NA SUSP: 2.0000 | Freq: Every day | NASAL | 2 refills | Status: DC

## 2018-03-20 NOTE — Discharge Instructions (Signed)
Use Flonase nasal spray daily for the next 2 to 3 weeks if not improving follow-up with [facility]

## 2018-03-20 NOTE — ED Triage Notes (Signed)
Patient c/o bilateral ear pain that started on Saturday.  Patient denies fevers.

## 2018-03-20 NOTE — ED Provider Notes (Signed)
[facility]    CSN: [id number] Arrival date & time: 03/20/18  1858     History   Chief Complaint Chief Complaint  Patient presents with  . Otalgia    HPI Joel Mills is a 37 y.o. male.   HPI  37 year old male presents with bilateral ear pain congestion and popping that started on Saturday days prior to this visit.  He said he had a slight cold about a week before that.  He denies any fevers or chills.  He has noticed that his balance is off recently becoming more dizzy.  No coughing.  He has had no drainage from his nose recently.  Denies sore throat          History reviewed. No pertinent past medical history.  Patient Active Problem List   Diagnosis Date Noted  . GI bleed 01/06/2018    Past Surgical History:  Procedure Laterality Date  . ADENOIDECTOMY    . ESOPHAGOGASTRODUODENOSCOPY (EGD) WITH PROPOFOL N/A 01/07/2018   Procedure: ESOPHAGOGASTRODUODENOSCOPY (EGD) WITH PROPOFOL;  Surgeon: Toledo, Boykin Nearingeodoro K, MD;  Location: [facility] ENDOSCOPY;  Service: Gastroenterology;  Laterality: N/A;       Home Medications    Prior to Admission medications   Medication Sig Start Date End Date Taking? Authorizing Provider  ferrous sulfate 325 (65 FE) MG tablet Take 1 tablet (325 mg total) by mouth 2 (two) times daily with a meal. 01/07/18 03/20/18 Yes Sudini, Wardell HeathSrikar, MD  methocarbamol (ROBAXIN) 500 MG tablet Take 1 tablet (500 mg total) by mouth at bedtime. 12/18/17  Yes Cuthriell, Delorise RoyalsJonathan D, PA-C  pantoprazole (PROTONIX) 40 MG tablet Take 1 tablet (40 mg total) by mouth 2 (two) times daily before a meal. 01/07/18  Yes Sudini, Wardell HeathSrikar, MD  acetaminophen (TYLENOL) 500 MG tablet Take 500-1,000 mg by mouth every 6 (six) hours as needed for mild pain or moderate pain.    [provider]  ondansetron (ZOFRAN ODT) 8 MG disintegrating tablet Take 1 tablet (8 mg total) by mouth every 8 (eight) hours as needed. Patient not taking: Reported on 01/06/2018 12/29/17   Payton Mccallumonty,  [place], MD    Family History Family History  Problem Relation Age of Onset  . Diabetes Mother   . Hypertension Father     Social History Social History   Tobacco Use  . Smoking status: Light Tobacco Smoker    Types: Cigarettes  . Smokeless tobacco: Never Used  Substance Use Topics  . Alcohol use: Yes    Comment: socially  . Drug use: Yes    Types: Marijuana     Allergies   Patient has no known allergies.   Review of Systems Review of Systems  Constitutional: Positive for activity change. Negative for chills, fatigue and fever.  HENT: Positive for ear pain.   All other systems reviewed and are negative.    Physical Exam Triage Vital Signs ED Triage Vitals  Enc Vitals Group     BP 03/20/18 1911 121/73     Pulse Rate 03/20/18 1911 69     Resp 03/20/18 1911 16     Temp 03/20/18 1911 98.2 F (36.8 C)     Temp Source 03/20/18 1911 Oral     SpO2 03/20/18 1911 100 %     Weight 03/20/18 1908 181 lb (82.1 kg)     Height 03/20/18 1908 6\' 3"  (1.905 m)     Head Circumference --      Peak Flow --      Pain Score 03/20/18  1908 4     Pain Loc --      Pain Edu? --      Excl. in GC? --    No data found.  Updated Vital Signs BP 121/73 (BP Location: Left Arm)   Pulse 69   Temp 98.2 F (36.8 C) (Oral)   Resp 16   Ht 6\' 3"  (1.905 m)   Wt 181 lb (82.1 kg)   SpO2 100%   BMI 22.62 kg/m   Visual Acuity Right Eye Distance:   Left Eye Distance:   Bilateral Distance:    Right Eye Near:   Left Eye Near:    Bilateral Near:     Physical Exam  Constitutional: He is oriented to person, place, and time. He appears well-developed and well-nourished. No distress.  HENT:  Head: Normocephalic.  Nose: Nose normal.  Mouth/Throat: Oropharynx is clear and moist. No oropharyngeal exudate.  Exam of the ears shows a hearing TM leave with shin of the cone of light.  Appears to be retraction bilaterally.  Eyes: Pupils are equal, round, and reactive to light. Conjunctivae are  normal. Right eye exhibits no discharge. Left eye exhibits no discharge.  Neck: Normal range of motion.  Musculoskeletal: Normal range of motion.  Neurological: He is alert and oriented to person, place, and time.  Skin: Skin is warm and dry. He is not diaphoretic.  Psychiatric: He has a normal mood and affect. His behavior is normal. Judgment and thought content normal.  Nursing note and vitals reviewed.    UC Treatments / Results  Labs (all labs ordered are listed, but only abnormal results are displayed) Labs Reviewed - No data to display  EKG None  Radiology No results found.  Procedures Procedures (including critical care time)  Medications Ordered in UC Medications - No data to display  Initial Impression / Assessment and Plan / UC Course  I have reviewed the triage vital signs and the nursing notes.  Pertinent labs & imaging results that were available during my care of the patient were reviewed by me and considered in my medical decision making (see chart for details).   Told the patient he has eustachian tube dysfunction of both ears probably from the cold he had a 1 week prior.  Begin using Flonase nasal spray daily for the next 2 to 3 weeks.  If that is not effective is to follow [facility]   Final Clinical Impressions(s) / UC Diagnoses   Final diagnoses:  Eustachian tube dysfunction, bilateral     Discharge Instructions     Use Flonase nasal spray daily for the next 2 to 3 weeks if not improving follow-up with [facility]   ED Prescriptions    None     Controlled Substance Prescriptions [place] Controlled Substance Registry consulted? Not Applicable   Lutricia Feil, PA-C 03/20/18 1946

## 2018-05-04 ENCOUNTER — Encounter: Payer: Self-pay | Admitting: Physician Assistant

## 2018-05-04 ENCOUNTER — Other Ambulatory Visit: Payer: Self-pay

## 2018-05-04 ENCOUNTER — Emergency Department
Admission: EM | Admit: 2018-05-04 | Discharge: 2018-05-04 | Disposition: A | Discharge status: Home / Self Care | Payer: Self-pay | Attending: Emergency Medicine | Admitting: Emergency Medicine

## 2018-05-04 ENCOUNTER — Emergency Department: Payer: Self-pay

## 2018-05-04 DIAGNOSIS — Y929 Unspecified place or not applicable: Secondary | ICD-10-CM | POA: Insufficient documentation

## 2018-05-04 DIAGNOSIS — W228XXA Striking against or struck by other objects, initial encounter: Secondary | ICD-10-CM | POA: Insufficient documentation

## 2018-05-04 DIAGNOSIS — Y998 Other external cause status: Secondary | ICD-10-CM | POA: Insufficient documentation

## 2018-05-04 DIAGNOSIS — F17210 Nicotine dependence, cigarettes, uncomplicated: Secondary | ICD-10-CM | POA: Insufficient documentation

## 2018-05-04 DIAGNOSIS — Z79899 Other long term (current) drug therapy: Secondary | ICD-10-CM | POA: Insufficient documentation

## 2018-05-04 DIAGNOSIS — S60221A Contusion of right hand, initial encounter: Secondary | ICD-10-CM | POA: Insufficient documentation

## 2018-05-04 DIAGNOSIS — Y9389 Activity, other specified: Secondary | ICD-10-CM | POA: Insufficient documentation

## 2018-05-04 NOTE — ED Notes (Signed)
Splint applied by PA Marisue Humble

## 2018-05-04 NOTE — ED Triage Notes (Signed)
Pt states he punched "something solid" approx one hour pta. Pt with injury and swelling noted to right hand, cms intact to right fingers,ice applied in triage.

## 2018-05-04 NOTE — Discharge Instructions (Signed)
Your exam is consistent with swelling to the right hand. There is no evidence of fracture. Apply ice and take OTC ibuprofen and Tylenol for pain & inflammation. Follow-up with your provider or a local urgent care as needed.
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
## 2018-05-04 NOTE — ED Notes (Signed)
Pt verbalized understanding of discharge instructions. NAD at this time. 

## 2018-09-23 ENCOUNTER — Other Ambulatory Visit: Payer: Self-pay

## 2018-09-23 ENCOUNTER — Emergency Department: Payer: Self-pay

## 2018-09-23 ENCOUNTER — Encounter: Payer: Self-pay | Admitting: Emergency Medicine

## 2018-09-23 ENCOUNTER — Emergency Department
Admission: EM | Admit: 2018-09-23 | Discharge: 2018-09-23 | Disposition: A | Discharge status: Home / Self Care | Payer: Self-pay | Attending: Emergency Medicine | Admitting: Emergency Medicine

## 2018-09-23 DIAGNOSIS — R200 Anesthesia of skin: Secondary | ICD-10-CM | POA: Insufficient documentation

## 2018-09-23 DIAGNOSIS — R4182 Altered mental status, unspecified: Secondary | ICD-10-CM | POA: Insufficient documentation

## 2018-09-23 DIAGNOSIS — M25532 Pain in left wrist: Secondary | ICD-10-CM | POA: Insufficient documentation

## 2018-09-23 DIAGNOSIS — Z79899 Other long term (current) drug therapy: Secondary | ICD-10-CM | POA: Insufficient documentation

## 2018-09-23 DIAGNOSIS — Z720 Tobacco use: Secondary | ICD-10-CM | POA: Insufficient documentation

## 2018-09-23 LAB — COMPREHENSIVE METABOLIC PANEL
ALT: 22 U/L (ref 0–44)
AST: 21 U/L (ref 15–41)
Albumin: 4.6 g/dL (ref 3.5–5.0)
Alkaline Phosphatase: 60 U/L (ref 38–126)
Anion gap: 5 (ref 5–15)
BUN: 15 mg/dL (ref 6–20)
CO2: 24 mmol/L (ref 22–32)
Calcium: 9 mg/dL (ref 8.9–10.3)
Chloride: 108 mmol/L (ref 98–111)
Creatinine, Ser: 0.88 mg/dL (ref 0.61–1.24)
GFR calc Af Amer: >60 mL/min (ref >60)
GFR calc non Af Amer: >60 mL/min (ref >60)
Glucose, Bld: 103 mg/dL — ABNORMAL HIGH (ref 70–99)
Potassium: 4.1 mmol/L (ref 3.5–5.1)
Sodium: 137 mmol/L (ref 135–145)
Total Bilirubin: 1 mg/dL (ref 0.3–1.2)
Total Protein: 7.3 g/dL (ref 6.5–8.1)

## 2018-09-23 LAB — CBC WITH DIFFERENTIAL/PLATELET
Abs Immature Granulocytes: 0.01 10*3/uL (ref 0.00–0.07)
Basophils Absolute: 0 10*3/uL (ref 0.0–0.1)
Basophils Relative: 0 %
Eosinophils Absolute: 0.1 10*3/uL (ref 0.0–0.5)
Eosinophils Relative: 1 %
HCT: 41.7 % (ref 39.0–52.0)
Hemoglobin: 14.8 g/dL (ref 13.0–17.0)
Immature Granulocytes: 0 %
Lymphocytes Relative: 13 %
Lymphs Abs: 1 10*3/uL (ref 0.7–4.0)
MCH: 31.6 pg (ref 26.0–34.0)
MCHC: 35.5 g/dL (ref 30.0–36.0)
MCV: 88.9 fL (ref 80.0–100.0)
Monocytes Absolute: 0.8 10*3/uL (ref 0.1–1.0)
Monocytes Relative: 10 %
Neutro Abs: 5.9 10*3/uL (ref 1.7–7.7)
Neutrophils Relative %: 76 %
Platelets: 168 10*3/uL (ref 150–400)
RBC: 4.69 MIL/uL (ref 4.22–5.81)
RDW: 13.4 % (ref 11.5–15.5)
WBC: 7.7 10*3/uL (ref 4.0–10.5)
nRBC: 0 % (ref 0.0–0.2)

## 2018-09-23 LAB — URINALYSIS, COMPLETE (UACMP) WITH MICROSCOPIC
Bacteria, UA: NONE SEEN
Bilirubin Urine: NEGATIVE
Glucose, UA: NEGATIVE mg/dL
Hgb urine dipstick: NEGATIVE
Ketones, ur: NEGATIVE mg/dL
Leukocytes,Ua: NEGATIVE
Nitrite: NEGATIVE
Protein, ur: NEGATIVE mg/dL
Specific Gravity, Urine: 1.025 (ref 1.005–1.030)
pH: 6 (ref 5.0–8.0)

## 2018-09-23 LAB — URINE DRUG SCREEN, QUALITATIVE (ARMC ONLY)
Amphetamines, Ur Screen: NOT DETECTED
Barbiturates, Ur Screen: NOT DETECTED
Benzodiazepine, Ur Scrn: NOT DETECTED
Cannabinoid 50 Ng, Ur ~~LOC~~: POSITIVE — AB
Cocaine Metabolite,Ur ~~LOC~~: NOT DETECTED
MDMA (Ecstasy)Ur Screen: NOT DETECTED
Methadone Scn, Ur: NOT DETECTED
Opiate, Ur Screen: NOT DETECTED
Phencyclidine (PCP) Ur S: NOT DETECTED
Tricyclic, Ur Screen: NOT DETECTED

## 2018-09-23 MED ORDER — LORAZEPAM 2 MG/ML IJ SOLN
1.0000 mg | Freq: Once | INTRAMUSCULAR | Status: AC
  Administered 2018-09-23: 1 mg via INTRAVENOUS
  Filled 2018-09-23: qty 1

## 2018-09-23 NOTE — ED Notes (Signed)
Wife updated via phone
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
## 2018-09-23 NOTE — ED Notes (Signed)
Splint applied to left wrist. Pt tolerated with no issue.

## 2018-09-23 NOTE — Discharge Instructions (Addendum)
Wear the splint.  Keep your wrist up as much as possible.  Call the orthopedic surgeon, Dr. Hyacinth Meeker, for an appointment on Tuesday.  He should be able to see you within a week.  Return for any increasing pain or new findings.

## 2018-09-23 NOTE — ED Notes (Signed)
Patient transported to CT 

## 2018-09-23 NOTE — ED Notes (Signed)
Pt resting in NAD at this time.
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
## 2018-09-23 NOTE — ED Notes (Signed)
Wife updated again by phone.

## 2018-09-29 ENCOUNTER — Encounter: Payer: Self-pay | Admitting: Emergency Medicine

## 2018-09-29 ENCOUNTER — Ambulatory Visit
Admission: EM | Admit: 2018-09-29 | Discharge: 2018-09-29 | Disposition: A | Discharge status: Home / Self Care | Payer: Self-pay | Attending: Emergency Medicine | Admitting: Emergency Medicine

## 2018-09-29 ENCOUNTER — Ambulatory Visit: Payer: Self-pay

## 2018-09-29 ENCOUNTER — Other Ambulatory Visit: Payer: Self-pay

## 2018-09-29 DIAGNOSIS — M5441 Lumbago with sciatica, right side: Secondary | ICD-10-CM

## 2018-09-29 DIAGNOSIS — Z79899 Other long term (current) drug therapy: Secondary | ICD-10-CM | POA: Insufficient documentation

## 2018-09-29 DIAGNOSIS — Z833 Family history of diabetes mellitus: Secondary | ICD-10-CM | POA: Insufficient documentation

## 2018-09-29 DIAGNOSIS — Z7952 Long term (current) use of systemic steroids: Secondary | ICD-10-CM | POA: Insufficient documentation

## 2018-09-29 DIAGNOSIS — Z87891 Personal history of nicotine dependence: Secondary | ICD-10-CM | POA: Insufficient documentation

## 2018-09-29 DIAGNOSIS — W19XXXA Unspecified fall, initial encounter: Secondary | ICD-10-CM | POA: Insufficient documentation

## 2018-09-29 MED ORDER — CYCLOBENZAPRINE HCL 10 MG PO TABS: 10.0000 mg | ORAL_TABLET | Freq: Two times a day (BID) | ORAL | 0 refills | Status: DC | PRN

## 2018-09-29 MED ORDER — PREDNISONE 10 MG PO TABS: ORAL_TABLET | ORAL | 0 refills | Status: DC

## 2018-09-29 MED ORDER — ONDANSETRON 4 MG PO TBDP: 4.0000 mg | ORAL_TABLET | Freq: Three times a day (TID) | ORAL | 0 refills | Status: DC | PRN
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
## 2018-09-29 NOTE — ED Provider Notes (Signed)
[facility] ____________________________________________  Time seen: Approximately 3:52 PM  I have reviewed the triage vital signs and the nursing notes.   HISTORY  Chief Complaint Fall and Back Pain   HPI Joel Mills is a 38 y.o. male presenting for evaluation of low back pain since Sunday evening.  Patient reports after he took a nap Sunday during the day he got up to start doing some work around the house.  States he was on a ladder and missed stepped at the end of causing him to miss a bottom step and fell backwards.  States that he fell backwards on his back against tools. Denies head injury, loss of consciousness or other injuries.  States he has had low back pain since.  States pain improves with sitting or standing completely still.  States if he lays completely still he has minimal pain.  States pain is severe with movement and direct palpation.  Has been taken over-the-counter Tylenol without resolution.  States he has had occasional low back pain in the past.  States pain occasionally radiates down the right leg but nowhere else.  Denies urinary or bowel retention or incontinence.  Did report he had episode of nausea and vomiting when he moves quickly due to the pain.  Denies abdominal complaints.  Denies abdominal pain, chest pain, shortness of breath, fever, hip pain or lower extremity injury.  Denies neck or upper back pain.  Reports otherwise doing well.   History reviewed. No pertinent past medical history.  Patient Active Problem List   Diagnosis Date Noted  . GI bleed 01/06/2018    Past Surgical History:  Procedure Laterality Date  . ADENOIDECTOMY    . ESOPHAGOGASTRODUODENOSCOPY (EGD) WITH PROPOFOL N/A 01/07/2018   Procedure: ESOPHAGOGASTRODUODENOSCOPY (EGD) WITH PROPOFOL;  Surgeon: Toledo, Boykin Nearing, MD;  Location: [facility] ENDOSCOPY;  Service: Gastroenterology;  Laterality: N/A;     No current facility-administered medications for this encounter.    Current Outpatient Medications:  .  acetaminophen (TYLENOL) 500 MG tablet, Take 500-1,000 mg by mouth every 6 (six) hours as needed for mild pain or moderate pain., Disp: , Rfl:  .  cyclobenzaprine (FLEXERIL) 10 MG tablet, Take 1 tablet (10 mg total) by mouth 2 (two) times daily as needed for muscle spasms. Do not drive while taking as can cause drowsiness, Disp: 15 tablet, Rfl: 0 .  ferrous sulfate 325 (65 FE) MG tablet, Take 1 tablet (325 mg total) by mouth 2 (two) times daily with a meal., Disp: 60 tablet, Rfl: 0 .  ondansetron (ZOFRAN ODT) 4 MG disintegrating tablet, Take 1 tablet (4 mg total) by mouth every 8 (eight) hours as needed for nausea or vomiting., Disp: 8 tablet, Rfl: 0 .  pantoprazole (PROTONIX) 40 MG tablet, Take 1 tablet (40 mg total) by mouth 2 (two) times daily before a meal., Disp: 60 tablet, Rfl: 0 .  predniSONE (DELTASONE) 10 MG tablet, Start 60 mg po day one, then 50 mg po day two, taper by 10 mg daily until complete., Disp: 21 tablet, Rfl: 0  Allergies Patient has no known allergies.  Family History  Problem Relation Age of Onset  . Diabetes Mother   . Hypertension Father     Social History Social History   Tobacco Use  . Smoking status: Former Smoker    Types: Cigarettes  . Smokeless tobacco: Never Used  Substance Use Topics  . Alcohol use: Yes    Comment: socially  . Drug use: Yes    Types: Marijuana  Review of Systems Constitutional: No fever Cardiovascular: Denies chest pain. Respiratory: Denies shortness of breath. Gastrointestinal: No abdominal pain. No diarrhea.  No constipation. Genitourinary: Negative for dysuria. Musculoskeletal: positive for back pain. Skin: Negative for rash. Neurological: Negative for headaches, focal weakness or numbness.   ____________________________________________   PHYSICAL EXAM:  VITAL SIGNS: ED Triage Vitals  Enc Vitals Group     BP 09/29/18 1416 110/73     Pulse Rate 09/29/18 1416 66     Resp  09/29/18 1416 16     Temp 09/29/18 1416 98 F (36.7 C)     Temp Source 09/29/18 1416 Oral     SpO2 09/29/18 1416 100 %     Weight 09/29/18 1413 180 lb (81.6 kg)     Height 09/29/18 1413 6\' 3"  (1.905 m)     Head Circumference --      Peak Flow --      Pain Score 09/29/18 1413 8     Pain Loc --      Pain Edu? --      Excl. in [place]? --     Constitutional: Alert and oriented. Well appearing and in no acute distress. ENT      Head: Normocephalic and atraumatic. Cardiovascular: Normal rate, regular rhythm. Grossly normal heart sounds.  Good peripheral circulation. Respiratory: Normal respiratory effort without tachypnea nor retractions. Breath sounds are clear and equal bilaterally. No wheezes, rales, rhonchi. Gastrointestinal: Soft and nontender.  Musculoskeletal: No midline cervical or thoracic tenderness palpation.  Bilateral distal pedal pulses equal and easily palpated. Except: Mild to moderate midline lower lumbar and right para lumbar tenderness to palpation, no swelling, no ecchymosis, skin intact, mild pain with right straight leg test, no pain with left straight leg test, no saddle anesthesia, 2+ bilateral patellar reflexes, ambulatory with steady gait, no hip or thigh tenderness palpation, bilateral plantar flexion dorsiflexion strong and equal. Neurologic:  Normal speech and language. No gross focal neurologic deficits are appreciated. Speech is normal. No gait instability.  Skin:  Skin is warm, dry and intact. No rash noted. Psychiatric: Mood and affect are normal. Speech and behavior are normal. Patient exhibits appropriate insight and judgment   ___________________________________________   LABS (all labs ordered are listed, but only abnormal results are displayed)  Labs Reviewed - No data to display ____________________________________________  RADIOLOGY  Dg Lumbar Spine Complete  Result Date: 09/29/2018 CLINICAL DATA:  Low back pain since a fall 09/23/2018. Initial  encounter. EXAM: LUMBAR SPINE - COMPLETE 4+ VIEW COMPARISON:  Plain films lumbar spine 12/18/2017. FINDINGS: There is no evidence of lumbar spine fracture. Alignment is normal. Intervertebral disc spaces are maintained. IMPRESSION: Normal exam. Electronically Signed   By: Drusilla Kanner M.D.   On: 09/29/2018 14:52   ____________________________________________   PROCEDURES Procedures    INITIAL IMPRESSION / ASSESSMENT AND PLAN / ED COURSE  Pertinent labs & imaging results that were available during my care of the patient were reviewed by me and considered in my medical decision making (see chart for details).  Well-appearing patient.  No acute distress.  Low back pain after fall this past Sunday.  Denies other injury.  No focal neurological deficits.  Lumbar spine as above per radiologist, normal exam.  Suspect contusion and strain injuries. recommend for follow-up with primary care or orthopedic this week.  Will treat with prednisone and PRN Flexeril.  Ice.  Discussed strict follow-up and return parameters including proceed directly to the emergency room for increased pain, difficulty ambulating, paresthesias,  urinary or bowel changes or worsening concerns.Discussed indication, risks and benefits of medications with patient.  Discussed follow up with Primary care physician this week. Discussed follow up and return parameters including no resolution or any worsening concerns. Patient verbalized understanding and agreed to plan.   ____________________________________________   FINAL CLINICAL IMPRESSION(S) / ED DIAGNOSES  Final diagnoses:  Acute right-sided low back pain with right-sided sciatica  Fall, initial encounter     ED Discharge Orders         Ordered    predniSONE (DELTASONE) 10 MG tablet     09/29/18 1553    cyclobenzaprine (FLEXERIL) 10 MG tablet  2 times daily PRN     09/29/18 1553    ondansetron (ZOFRAN ODT) 4 MG disintegrating tablet  Every 8 hours PRN     09/29/18  1558           Note: This dictation was prepared with Dragon dictation along with smaller phrase technology. Any transcriptional errors that result from this process are unintentional.         Renford DillsMiller, Natina Wiginton, NP 09/29/18 1839

## 2018-09-29 NOTE — Discharge Instructions (Signed)
Take medication as prescribed. Rest. Drink plenty of fluids. Ice.   Follow up with your primary care physician or orthopedic this week as needed. Return to Urgent care as needed. Proceed directly to emergency room for increased pain, numbness, worsening concerns.

## 2018-10-27 ENCOUNTER — Ambulatory Visit
Admission: EM | Admit: 2018-10-27 | Discharge: 2018-10-27 | Disposition: A | Discharge status: Home / Self Care | Payer: Medicaid Other | Attending: Family Medicine | Admitting: Family Medicine

## 2018-10-27 ENCOUNTER — Other Ambulatory Visit: Payer: Self-pay

## 2018-10-27 ENCOUNTER — Encounter: Payer: Self-pay | Admitting: Emergency Medicine

## 2018-10-27 DIAGNOSIS — Z23 Encounter for immunization: Secondary | ICD-10-CM

## 2018-10-27 DIAGNOSIS — W450XXA Nail entering through skin, initial encounter: Secondary | ICD-10-CM

## 2018-10-27 DIAGNOSIS — T148XXA Other injury of unspecified body region, initial encounter: Secondary | ICD-10-CM

## 2018-10-27 MED ORDER — MUPIROCIN 2 % EX OINT: 1.0000 "application " | TOPICAL_OINTMENT | Freq: Two times a day (BID) | CUTANEOUS | 0 refills | Status: AC

## 2018-10-27 MED ORDER — TETANUS-DIPHTH-ACELL PERTUSSIS 5-2.5-18.5 LF-MCG/0.5 IM SUSP
0.5000 mL | Freq: Once | INTRAMUSCULAR | Status: AC
  Administered 2018-10-27: 0.5 mL via INTRAMUSCULAR

## 2018-10-27 NOTE — ED Triage Notes (Signed)
Patient states that he stepped on a nail on Monday and needs a tetanus vaccine.

## 2018-10-27 NOTE — ED Provider Notes (Signed)
[facility]    CSN: [id number] Arrival date & time: 10/27/18  1103  History   Chief Complaint Chief Complaint  Patient presents with  . Puncture Wound    right foot   HPI  38 year old male presents with a puncture wound to his right foot.  Patient reports that he stepped on a nail on Monday.  He was wearing shoes.  Went through his shoe and into his foot on the plantar aspect.  Nail was easily removed.  Patient thought that he was up to date on his tetanus but then began looking in his chart and could not find his most recent tetanus.  He is having mild to moderate pain at the site.  Worse with pressure/palpation.  Is clean the area.  No surrounding redness.  No drainage.  Patient desires tetanus vaccination today.  No other reported symptoms.  No other complaints.  PMH, Surgical Hx, Family Hx, Social History reviewed and updated as below.  Patient Active Problem List   Diagnosis Date Noted  . GI bleed 01/06/2018  H pylori infection Panic disorder  Past Surgical History:  Procedure Laterality Date  . ADENOIDECTOMY    . ESOPHAGOGASTRODUODENOSCOPY (EGD) WITH PROPOFOL N/A 01/07/2018   Procedure: ESOPHAGOGASTRODUODENOSCOPY (EGD) WITH PROPOFOL;  Surgeon: Toledo, Boykin Nearingeodoro K, MD;  Location: [facility] ENDOSCOPY;  Service: Gastroenterology;  Laterality: N/A;   Home Medications    Prior to Admission medications   Medication Sig Start Date End Date Taking? Authorizing Provider  acetaminophen (TYLENOL) 500 MG tablet Take 500-1,000 mg by mouth every 6 (six) hours as needed for mild pain or moderate pain.    [provider]  ferrous sulfate 325 (65 FE) MG tablet Take 1 tablet (325 mg total) by mouth 2 (two) times daily with a meal. 01/07/18 03/20/18  Milagros LollSudini, Srikar, MD  mupirocin ointment (BACTROBAN) 2 % Apply 1 application topically 2 (two) times daily for 7 days. 10/27/18 11/03/18  Tommie Samsook, Samantha Olivera G, DO  ondansetron (ZOFRAN ODT) 4 MG disintegrating tablet Take 1 tablet (4 mg total) by  mouth every 8 (eight) hours as needed for nausea or vomiting. 09/29/18   Renford DillsMiller, Lindsey, NP  pantoprazole (PROTONIX) 40 MG tablet Take 1 tablet (40 mg total) by mouth 2 (two) times daily before a meal. 01/07/18 10/27/18  Milagros LollSudini, Srikar, MD    Family History Family History  Problem Relation Age of Onset  . Diabetes Mother   . Hypertension Father     Social History Social History   Tobacco Use  . Smoking status: Former Smoker    Types: Cigarettes  . Smokeless tobacco: Never Used  Substance Use Topics  . Alcohol use: Yes    Comment: socially  . Drug use: Yes    Types: Marijuana     Allergies   Patient has no known allergies.   Review of Systems Review of Systems  Constitutional: Negative.   Skin: Positive for wound.   Physical Exam Triage Vital Signs ED Triage Vitals  Enc Vitals Group     BP 10/27/18 1133 125/76     Pulse Rate 10/27/18 1133 71     Resp 10/27/18 1133 16     Temp 10/27/18 1133 98.3 F (36.8 C)     Temp Source 10/27/18 1133 Oral     SpO2 10/27/18 1133 100 %     Weight 10/27/18 1130 185 lb (83.9 kg)     Height 10/27/18 1130 6\' 3"  (1.905 m)     Head Circumference --  Peak Flow --      Pain Score 10/27/18 1130 6     Pain Loc --      Pain Edu? --      Excl. in [place]? --    Updated Vital Signs BP 125/76 (BP Location: Right Arm)   Pulse 71   Temp 98.3 F (36.8 C) (Oral)   Resp 16   Ht 6\' 3"  (1.905 m)   Wt 83.9 kg   SpO2 100%   BMI 23.12 kg/m   Visual Acuity Right Eye Distance:   Left Eye Distance:   Bilateral Distance:    Right Eye Near:   Left Eye Near:    Bilateral Near:     Physical Exam Vitals signs and nursing note reviewed.  Constitutional:      General: He is not in acute distress.    Appearance: Normal appearance.  HENT:     Head: Normocephalic and atraumatic.  Eyes:     General:        Right eye: No discharge.        Left eye: No discharge.     Conjunctiva/sclera: Conjunctivae normal.  Pulmonary:     Effort:  Pulmonary effort is normal. No respiratory distress.  Skin:    Comments: Right foot with a small, healing puncture wound on the plantar aspect.  No surrounding erythema.  No drainage.  Neurological:     Mental Status: He is alert.  Psychiatric:        Mood and Affect: Mood normal.        Behavior: Behavior normal.    UC Treatments / Results  Labs (all labs ordered are listed, but only abnormal results are displayed) Labs Reviewed - No data to display  EKG None  Radiology No results found.  Procedures Procedures (including critical care time)  Medications Ordered in UC Medications  Tdap (BOOSTRIX) injection 0.5 mL (0.5 mLs Intramuscular Given 10/27/18 1136)    Initial Impression / Assessment and Plan / UC Course  I have reviewed the triage vital signs and the nursing notes.  Pertinent labs & imaging results that were available during my care of the patient were reviewed by me and considered in my medical decision making (see chart for details).    38 year old male presents with a puncture wound.  Tetanus given today.  Bactroban ointment as prescribed.  No need for oral antibiotic prophylaxis at this point as he is several days out from his injury.  Final Clinical Impressions(s) / UC Diagnoses   Final diagnoses:  Puncture wound   Discharge Instructions   None    ED Prescriptions    Medication Sig Dispense Auth. Provider   mupirocin ointment (BACTROBAN) 2 % Apply 1 application topically 2 (two) times daily for 7 days. 22 g Coral Spikes, DO     Controlled Substance Prescriptions [place] Controlled Substance Registry consulted? Not Applicable   Coral Spikes, DO 10/27/18 1232

## 2019-03-04 ENCOUNTER — Other Ambulatory Visit: Payer: Self-pay

## 2019-03-04 DIAGNOSIS — Z20822 Contact with and (suspected) exposure to covid-19: Secondary | ICD-10-CM

## 2019-03-07 ENCOUNTER — Other Ambulatory Visit: Payer: Self-pay

## 2019-03-07 DIAGNOSIS — Z20822 Contact with and (suspected) exposure to covid-19: Secondary | ICD-10-CM

## 2019-03-07 LAB — NOVEL CORONAVIRUS, NAA

## 2019-03-09 LAB — NOVEL CORONAVIRUS, NAA: SARS-CoV-2, NAA: DETECTED — AB

## 2019-07-23 ENCOUNTER — Ambulatory Visit
Admission: EM | Admit: 2019-07-23 | Discharge: 2019-07-23 | Disposition: A | Discharge status: Home / Self Care | Payer: Medicaid Other | Attending: Urgent Care | Admitting: Urgent Care

## 2019-07-23 ENCOUNTER — Other Ambulatory Visit: Payer: Self-pay

## 2019-07-23 ENCOUNTER — Ambulatory Visit (INDEPENDENT_AMBULATORY_CARE_PROVIDER_SITE_OTHER): Payer: Self-pay

## 2019-07-23 ENCOUNTER — Encounter: Payer: Self-pay | Admitting: Emergency Medicine

## 2019-07-23 DIAGNOSIS — S93601A Unspecified sprain of right foot, initial encounter: Secondary | ICD-10-CM

## 2019-07-23 DIAGNOSIS — M79671 Pain in right foot: Secondary | ICD-10-CM

## 2019-07-23 DIAGNOSIS — Y92312 Tennis court as the place of occurrence of the external cause: Secondary | ICD-10-CM

## 2019-07-23 NOTE — Discharge Instructions (Signed)
It was very nice seeing you today in clinic. Thank you for entrusting me with your care.   Rest, ice, and elevate your foot. Wear brace. Limit weight bearing for the next few days.   Make arrangements to follow up with your regular doctor in 1 week for re-evaluation if not improving. If your symptoms/condition worsens, please seek follow up care either here or in the ER. Please remember, our [facility] providers are "right here with you" when you need [place].   Again, it was my pleasure to take care of you today. Thank you for choosing our clinic. I hope that you start to feel better quickly.   Quentin Mulling, MSN, APRN, FNP-C, CEN Advanced Practice Provider [facility]

## 2019-07-23 NOTE — ED Triage Notes (Signed)
Patient c/o right foot injury while playing tennis last night. He is c/o pain in the arch of his foot.

## 2019-07-24 NOTE — ED Provider Notes (Signed)
[place]   Name: Joel Mills DOB: 12/12/1980 MRN: [id number] CSN: [id number] PCP: Patient, No Pcp Per  Arrival date and time:  07/23/19 1728  Chief Complaint:  Foot Pain and Foot Injury  NOTE: Prior to seeing the patient today, I have reviewed the triage nursing documentation and vital signs. Clinical staff has updated patient's PMH/PSHx, current medication list, and drug allergies/intolerances to ensure comprehensive history available to assist in medical decision making.   History:   HPI: Joel Mills is a 39 y.o. male who presents today with complaints of pain in his RIGHT foot since last night. Patient reports that he was playing tennis when he rolled his foot. Patient presents today with pain and mild swelling to the dorsal aspect of his foot. He denies previous injuries or surgeries. Patient denies any associated ecchymosis. In efforts to conservatively manage his symptoms at home, the patient notes that he has used APAP, which has helped to improve his symptoms. Patient with self reported pain score of 5/10 today in clinic.   History reviewed. No pertinent past medical history.  Past Surgical History:  Procedure Laterality Date  . ADENOIDECTOMY    . ESOPHAGOGASTRODUODENOSCOPY (EGD) WITH PROPOFOL N/A 01/07/2018   Procedure: ESOPHAGOGASTRODUODENOSCOPY (EGD) WITH PROPOFOL;  Surgeon: Toledo, Boykin Nearing, MD;  Location: [facility] ENDOSCOPY;  Service: Gastroenterology;  Laterality: N/A;    Family History  Problem Relation Age of Onset  . Diabetes Mother   . Hypertension Father     Social History   Tobacco Use  . Smoking status: Former Smoker    Types: Cigarettes  . Smokeless tobacco: Never Used  Substance Use Topics  . Alcohol use: Yes    Comment: socially  . Drug use: Yes    Types: Marijuana    Patient Active Problem List   Diagnosis Date Noted  . GI bleed 01/06/2018    Home Medications:    No outpatient medications have been marked as taking for the 07/23/19  encounter [facility] Encounter).    Allergies:   Patient has no known allergies.  Review of Systems (ROS):  Review of systems NEGATIVE unless otherwise noted in narrative H&P section.   Vital Signs: Today's Vitals   07/23/19 1748 07/23/19 1749 07/23/19 1910  BP: (!) 137/94    Pulse: 84    Resp: 18    Temp: 98.2 F (36.8 C)    TempSrc: Oral    SpO2: 100%    Weight:  195 lb (88.5 kg)   Height:  6\' 3"  (1.905 m)   PainSc: 5   5     Physical Exam: Physical Exam  Constitutional: He is oriented to person, place, and time and well-developed, well-nourished, and in no distress.  HENT:  Head: Normocephalic and atraumatic.  Eyes: Pupils are equal, round, and reactive to light.  Cardiovascular: Normal rate and intact distal pulses.  Pulmonary/Chest: Effort normal. No respiratory distress.  Musculoskeletal:     Right foot: Normal capillary refill. Swelling and tenderness present. No deformity or crepitus.     Comments: (+) FROM. Tenderness and swelling reported overlying the dorsal metatarsals (mid-foot). (+) PMS with normal color, temperature, and capillary refill.   Neurological: He is alert and oriented to person, place, and time. Gait normal.  Skin: Skin is warm and dry. No rash noted. He is not diaphoretic.  Psychiatric: Mood, memory, affect and judgment normal.  Nursing note and vitals reviewed.   Urgent Care Treatments / Results:   Orders Placed This Encounter  Procedures  .  DG Foot Complete Right  . Apply ankle splint air cast    LABS: PLEASE NOTE: all labs that were ordered this encounter are listed, however only abnormal results are displayed. Labs Reviewed - No data to display  EKG: -None  RADIOLOGY: DG Foot Complete Right  Result Date: 07/23/2019 CLINICAL DATA:  Right foot pain and swelling secondary to a twisting injury last night playing tennis. EXAM: RIGHT FOOT COMPLETE - 3+ VIEW COMPARISON:  None. FINDINGS: There is no evidence of fracture or dislocation.  There is no evidence of arthropathy or other focal bone abnormality. Slight soft tissue swelling on the dorsum of the foot at the level of the ankle and midfoot. IMPRESSION: No acute bone abnormality. Soft tissue swelling. Electronically Signed   By: Francene Boyers M.D.   On: 07/23/2019 18:40    PROCEDURES: Procedures  MEDICATIONS RECEIVED THIS VISIT: Medications - No data to display  PERTINENT CLINICAL COURSE NOTES/UPDATES:   Initial Impression / Assessment and Plan / Urgent Care Course:  Pertinent labs & imaging results that were available during my care of the patient were personally reviewed by me and considered in my medical decision making (see lab/imaging section of note for values and interpretations).  Joel Mills is a 39 y.o. male who presents to [facility] today with complaints of Foot Pain and Foot Injury  Patient is well appearing overall in clinic today. He does not appear to be in any acute distress. Presenting symptoms (see HPI) and exam as documented above. Pain following sport's injury that occurred last night. Diagnostic radiographs of the RIGHT foot revealed no acute abnormalities; no fracture, dislocation, or effusion.  There is (+) soft tissue swelling noted. Suspect sprain of the foot. Will place in aircast for comfort and support during acute phase of sprain recovery. Patient does not wish to use crutches. He was encouraged to rest, ice, and elevate for the next few days. Dicussed making conscious effort to limit weight bearing and advance as tolerated as pain improves. Has been using APAP as needed for pain. Encouraged to add in some IBU, up to 800 mg TID, as needed for pain. He feels as if this will be adequate to control his pain and declines further intervention at this time. He was advised to return call to the clinic should his pain increase and he require something more potent.   Current clinical condition warrants patient being out of work in order to  recover from his current injury/illness. He was provided with the appropriate documentation to provide to his place of employment that will allow for him to RTW on 07/26/2019 with no restrictions.   Discussed follow up with primary care physician in 1 week for re-evaluation. I have reviewed the follow up and strict return precautions for any new or worsening symptoms. Patient is aware of symptoms that would be deemed urgent/emergent, and would thus require further evaluation either here or in the emergency department. At the time of discharge, he verbalized understanding and consent with the discharge plan as it was reviewed with him. All questions were fielded by provider and/or clinic staff prior to patient discharge.    Final Clinical Impressions / Urgent Care Diagnoses:   Final diagnoses:  Sprain of right foot, initial encounter    New Prescriptions:  [place] Controlled Substance Registry consulted? Not Applicable  No orders of the defined types were placed in this encounter.   Recommended Follow up Care:  Patient encouraged to follow up with the following provider  within the specified time frame, or sooner as dictated by the severity of his symptoms. As always, he was instructed that for any urgent/emergent care needs, he should seek care either here or in the emergency department for more immediate evaluation.  Follow-up Information    PCP In 1 week.   Why: General reassessment of symptoms if not improving        NOTE: This note was prepared using Lobbyist along with smaller Company secretary. Despite my best ability to proofread, there is the potential that transcriptional errors may still occur from this process, and are completely unintentional.    Karen Kitchens, NP 07/24/19 1140

## 2019-09-24 ENCOUNTER — Ambulatory Visit (INDEPENDENT_AMBULATORY_CARE_PROVIDER_SITE_OTHER): Payer: 59 | Admitting: Nurse Practitioner

## 2019-09-24 ENCOUNTER — Other Ambulatory Visit: Payer: Self-pay

## 2019-09-24 ENCOUNTER — Encounter: Payer: Self-pay | Admitting: Nurse Practitioner

## 2019-09-24 VITALS — BP 146/83 | HR 60 | Temp 97.8°F | Ht 73.23 in | Wt 192.6 lb

## 2019-09-24 DIAGNOSIS — R42 Dizziness and giddiness: Secondary | ICD-10-CM | POA: Diagnosis not present

## 2019-09-24 DIAGNOSIS — M5440 Lumbago with sciatica, unspecified side: Secondary | ICD-10-CM

## 2019-09-24 DIAGNOSIS — Z113 Encounter for screening for infections with a predominantly sexual mode of transmission: Secondary | ICD-10-CM | POA: Diagnosis not present

## 2019-09-24 DIAGNOSIS — R21 Rash and other nonspecific skin eruption: Secondary | ICD-10-CM

## 2019-09-24 DIAGNOSIS — G8929 Other chronic pain: Secondary | ICD-10-CM

## 2019-09-24 DIAGNOSIS — M5442 Lumbago with sciatica, left side: Secondary | ICD-10-CM | POA: Insufficient documentation

## 2019-09-24 DIAGNOSIS — R55 Syncope and collapse: Secondary | ICD-10-CM | POA: Insufficient documentation

## 2019-09-24 NOTE — Assessment & Plan Note (Signed)
Chronic, ongoing.  Rash not appreciated on examination today, advised to moisturize skin daily with moisturizer.  If persistent and particularly bothersome, can obtain allergy testing with allergist.

## 2019-09-24 NOTE — Progress Notes (Signed)
BP (!) 146/83 (BP Location: Left Arm, Patient Position: Sitting, Cuff Size: Normal)   Pulse 60   Temp 97.8 F (36.6 C) (Oral)   Ht 6' 1.23" (1.86 m)   Wt 192 lb 9.6 oz (87.4 kg)   SpO2 100%   BMI 25.25 kg/m    Subjective:    Patient ID: Joel Mills, male    DOB: [birth year], 39 y.o.   MRN: [id number]  HPI: Joel Mills is a 39 y.o. male presenting for new patient visit to establish care.  Introduced to Publishing rights manager role and practice setting.  All questions answered.  Discussed provider/patient relationship and expectations.   Chief Complaint  Patient presents with  . Establish Care  . Spots and/or Floaters    happens wiht the dizziness but not as often  . Dizziness    spells when he stands up from a kneeling point.   . Back Pain    travels to legs, constant pain,   . Skin Problem    rash/ dry skin on arms   DIZZINESS Reports that dizziness has been going on for years; lately it has been a little bit worse.  He is concerned because last time it got back, he had a bleeding gastric ulcer that ruptured.  He noticed blood in his stool before but has not noticed it at all recently.  He thinks the dizziness may also be an inner ear problem as he does get sea sick while riding on boats. Duration: years Description of symptoms: when going from sitting to standing, spins for a second then a "fuzzy" feeling Duration of episode: seconds, 10-45 seconds Dizziness frequency: recurrent; multiple times per day Provoking factors: changing positions; kneeling to standing or sitting to standing Aggravating factors:  changing position Triggered by rolling over in bed: no Triggered by bending over: yes Aggravated by head movement: yes Aggravated by exertion, coughing, loud noises: no Recent head injury: no Recent or current viral symptoms: no History of vasovagal episodes: no Nausea: yes Vomiting: no Tinnitus: yes Hearing loss: no Aural fullness: no Headache:  no Photophobia/phonophobia: no Unsteady gait: yes Diplopia, dysarthria, dysphagia or weakness: sees spots but no double vision or dysphagia or weakness Related to exertion: no Pallor: no Diaphoresis: no Dyspnea: no Chest pain: no   BACK PAIN Duration: years Mechanism of injury: multiple injuries in past Location: low back, bilateral Onset: gradual Severity: mild to severe Quality: dull ache with numbness and at times shooting pain that pulses Frequency: intermittent Radiation: both legs Aggravating factors: extended periods in certain positions Alleviating factors: heat, epsom salt bath Status: fluctuating Treatments attempted: heat, epsom salt bath   Relief with NSAIDs?: none taken Nighttime pain:  yes Paresthesias / decreased sensation:  yes Bowel / bladder incontinence:  no Fevers:  no Dysuria / urinary frequency:  no  RASH Patient reports it turns white in the sun  Duration:  years  Location: arms  Itching: sometimes Burning: no Redness: no  Oozing: no Scaling: no Blisters: no Painful: no Fevers: no Change in detergents/soaps/personal care products: no Recent illness: no Recent travel:no History of same: yes Context: fluctuating Alleviating factors: nothing Treatments attempted: lotion/moisturizer Shortness of breath: no  Throat/tongue swelling: no Myalgias/arthralgias: yes  Depression screen PHQ 2/9 09/24/2019  Decreased Interest 0  Down, Depressed, Hopeless 1  PHQ - 2 Score 1   No Known Allergies  Outpatient Encounter Medications as of 09/24/2019  Medication Sig  . Multiple Vitamin (MULTIVITAMIN) tablet Take 1 tablet by mouth daily.  . [  DISCONTINUED] ferrous sulfate 325 (65 FE) MG tablet Take 1 tablet (325 mg total) by mouth 2 (two) times daily with a meal.  . [DISCONTINUED] pantoprazole (PROTONIX) 40 MG tablet Take 1 tablet (40 mg total) by mouth 2 (two) times daily before a meal.   No facility-administered encounter medications on file as of  09/24/2019.   Active Ambulatory Problems    Diagnosis Date Noted  . GI bleed 01/06/2018  . Dizziness 09/24/2019  . Chronic bilateral low back pain with sciatica 09/24/2019  . Rash 09/24/2019   Resolved Ambulatory Problems    Diagnosis Date Noted  . No Resolved Ambulatory Problems   Past Medical History:  Diagnosis Date  . Anemia   . Asthma   . Depression    Past Medical History:  Diagnosis Date  . Anemia    39 years old  . Asthma   . Depression     Family History  Problem Relation Age of Onset  . Diabetes Mother   . Hypertension Father   . Depression Father   . Cancer Sister   . Depression Sister    Past Surgical History:  Procedure Laterality Date  . ADENOIDECTOMY    . ESOPHAGOGASTRODUODENOSCOPY (EGD) WITH PROPOFOL N/A 01/07/2018   Procedure: ESOPHAGOGASTRODUODENOSCOPY (EGD) WITH PROPOFOL;  Surgeon: Toledo, Benay Pike, MD;  Location: [facility] ENDOSCOPY;  Service: Gastroenterology;  Laterality: N/A;  . VASECTOMY     Social History   Tobacco Use  . Smoking status: Former Smoker    Types: Cigarettes    Quit date: 11/30/2018    Years since quitting: 0.8  . Smokeless tobacco: Never Used  Substance Use Topics  . Alcohol use: Yes    Comment: socially  . Drug use: Yes    Types: Marijuana   Review of Systems  Constitutional: Negative.  Negative for activity change, appetite change, fatigue and fever.  HENT: Positive for tinnitus. Negative for congestion, ear pain, hearing loss, sore throat and trouble swallowing.   Respiratory: Negative.  Negative for cough and shortness of breath.   Cardiovascular: Negative.  Negative for chest pain and palpitations.  Gastrointestinal: Positive for nausea. Negative for vomiting.  Genitourinary: Negative.  Negative for dysuria, frequency, hematuria and urgency.  Musculoskeletal: Positive for back pain and myalgias. Negative for arthralgias and neck stiffness.  Skin: Positive for color change and rash. Negative for pallor and wound.    Neurological: Positive for dizziness and numbness (down legs). Negative for weakness.  Hematological: Negative.  Negative for adenopathy.  Psychiatric/Behavioral: Negative.  Negative for agitation, confusion, decreased concentration and sleep disturbance. The patient is not nervous/anxious.    Per HPI unless specifically indicated above     Objective:    BP (!) 146/83 (BP Location: Left Arm, Patient Position: Sitting, Cuff Size: Normal)   Pulse 60   Temp 97.8 F (36.6 C) (Oral)   Ht 6' 1.23" (1.86 m)   Wt 192 lb 9.6 oz (87.4 kg)   SpO2 100%   BMI 25.25 kg/m   Wt Readings from Last 3 Encounters:  09/24/19 192 lb 9.6 oz (87.4 kg)  07/23/19 195 lb (88.5 kg)  10/27/18 185 lb (83.9 kg)    Orthostatic VS for the past 24 hrs:  BP- Lying Pulse- Lying BP- Sitting Pulse- Sitting BP- Standing at 0 minutes Pulse- Standing at 0 minutes  09/24/19 1009 122/75 59 117/72 67 127/81 68   Physical Exam Vitals and nursing note reviewed.  Constitutional:      General: He is not in acute  distress.    Appearance: Normal appearance. He is not toxic-appearing.  HENT:     Head: Normocephalic and atraumatic.     Right Ear: Tympanic membrane, ear canal and external ear normal. There is no impacted cerumen.     Left Ear: Tympanic membrane, ear canal and external ear normal. There is no impacted cerumen.     Nose: Nose normal. No congestion.     Mouth/Throat:     Mouth: Mucous membranes are moist.     Pharynx: No oropharyngeal exudate.  Eyes:     General: No scleral icterus.    Extraocular Movements: Extraocular movements intact.     Pupils: Pupils are equal, round, and reactive to light.  Cardiovascular:     Rate and Rhythm: Normal rate and regular rhythm.     Heart sounds: No murmur.  Pulmonary:     Effort: Pulmonary effort is normal. No respiratory distress.     Breath sounds: Normal breath sounds. No wheezing, rhonchi or rales.  Musculoskeletal:        General: Normal range of motion.      Cervical back: Normal range of motion. No rigidity.     Right lower leg: No edema.     Left lower leg: No edema.  Skin:    General: Skin is warm and dry.     Coloration: Skin is not jaundiced or pale.  Neurological:     General: No focal deficit present.     Mental Status: He is alert and oriented to person, place, and time.     Motor: No weakness.     Gait: Gait normal.  Psychiatric:        Mood and Affect: Mood normal.        Behavior: Behavior normal.        Thought Content: Thought content normal.        Judgment: Judgment normal.        Assessment & Plan:   Problem List Items Addressed This Visit      Nervous and Auditory   Chronic bilateral low back pain with sciatica    Chronic per patient, pain ongoing.  No red flags on history on in examination.  Will treat conservatively with back exercises, patient declines corticosteroid or PT referral today.  Follow up in 1 month.        Musculoskeletal and Integument   Rash    Chronic, ongoing.  Rash not appreciated on examination today, advised to moisturize skin daily with moisturizer.  If persistent and particularly bothersome, can obtain allergy testing with allergist.        Other   Dizziness - Primary    Chronic, ongoing.  Unclear etiology although EKG normal today in clinic and orthostatic vital signs negative.  Sounds positional, advised to increase water intake and in meantime, will obtain CBC with iron panel, CMP, and A1C.  Follow up in 1 month.      Relevant Orders   EKG 12-Lead (Completed)   CBC with Differential/Platelet   Iron, TIBC and Ferritin Panel   Comprehensive metabolic panel    Other Visit Diagnoses    Screen for STD (sexually transmitted disease)       Relevant Orders   HSV(herpes smplx)abs-1+2(IgG+IgM)-bld   RPR   GC/Chlamydia Probe Amp       Follow up plan: Return in about 4 weeks (around 10/22/2019) for follow up and physical.

## 2019-09-24 NOTE — Assessment & Plan Note (Signed)
Chronic per patient, pain ongoing.  No red flags on history on in examination.  Will treat conservatively with back exercises, patient declines corticosteroid or PT referral today.  Follow up in 1 month.

## 2019-09-24 NOTE — Assessment & Plan Note (Signed)
Chronic, ongoing.  Unclear etiology although EKG normal today in clinic and orthostatic vital signs negative.  Sounds positional, advised to increase water intake and in meantime, will obtain CBC with iron panel, CMP, and A1C.  Follow up in 1 month.

## 2019-09-24 NOTE — Patient Instructions (Signed)
Back Exercises The following exercises strengthen the muscles that help to support the trunk and back. They also help to keep the lower back flexible. Doing these exercises can help to prevent back pain or lessen existing pain.  If you have back pain or discomfort, try doing these exercises 2-3 times each day or as told by your health care Michai Dieppa.  As your pain improves, do them once each day, but increase the number of times that you repeat the steps for each exercise (do more repetitions).  To prevent the recurrence of back pain, continue to do these exercises once each day or as told by your health care Vernie Vinciguerra. Do exercises exactly as told by your health care Lalo Tromp and adjust them as directed. It is normal to feel mild stretching, pulling, tightness, or discomfort as you do these exercises, but you should stop right away if you feel sudden pain or your pain gets worse. Exercises Single knee to chest Repeat these steps 3-5 times for each leg: 1. Lie on your back on a firm bed or the floor with your legs extended. 2. Bring one knee to your chest. Your other leg should stay extended and in contact with the floor. 3. Hold your knee in place by grabbing your knee or thigh with both hands and hold. 4. Pull on your knee until you feel a gentle stretch in your lower back or buttocks. 5. Hold the stretch for 10-30 seconds. 6. Slowly release and straighten your leg. Pelvic tilt Repeat these steps 5-10 times: 1. Lie on your back on a firm bed or the floor with your legs extended. 2. Bend your knees so they are pointing toward the ceiling and your feet are flat on the floor. 3. Tighten your lower abdominal muscles to press your lower back against the floor. This motion will tilt your pelvis so your tailbone points up toward the ceiling instead of pointing to your feet or the floor. 4. With gentle tension and even breathing, hold this position for 5-10 seconds. Cat-cow Repeat these steps until  your lower back becomes more flexible: 1. Get into a hands-and-knees position on a firm surface. Keep your hands under your shoulders, and keep your knees under your hips. You may place padding under your knees for comfort. 2. Let your head hang down toward your chest. Contract your abdominal muscles and point your tailbone toward the floor so your lower back becomes rounded like the back of a cat. 3. Hold this position for 5 seconds. 4. Slowly lift your head, let your abdominal muscles relax and point your tailbone up toward the ceiling so your back forms a sagging arch like the back of a cow. 5. Hold this position for 5 seconds.  Press-ups Repeat these steps 5-10 times: 1. Lie on your abdomen (face-down) on the floor. 2. Place your palms near your head, about shoulder-width apart. 3. Keeping your back as relaxed as possible and keeping your hips on the floor, slowly straighten your arms to raise the top half of your body and lift your shoulders. Do not use your back muscles to raise your upper torso. You may adjust the placement of your hands to make yourself more comfortable. 4. Hold this position for 5 seconds while you keep your back relaxed. 5. Slowly return to lying flat on the floor.  Bridges Repeat these steps 10 times: 1. Lie on your back on a firm surface. 2. Bend your knees so they are pointing toward the ceiling and   your feet are flat on the floor. Your arms should be flat at your sides, next to your body. 3. Tighten your buttocks muscles and lift your buttocks off the floor until your waist is at almost the same height as your knees. You should feel the muscles working in your buttocks and the back of your thighs. If you do not feel these muscles, slide your feet 1-2 inches farther away from your buttocks. 4. Hold this position for 3-5 seconds. 5. Slowly lower your hips to the starting position, and allow your buttocks muscles to relax completely. If this exercise is too easy, try  doing it with your arms crossed over your chest. Abdominal crunches Repeat these steps 5-10 times: 1. Lie on your back on a firm bed or the floor with your legs extended. 2. Bend your knees so they are pointing toward the ceiling and your feet are flat on the floor. 3. Cross your arms over your chest. 4. Tip your chin slightly toward your chest without bending your neck. 5. Tighten your abdominal muscles and slowly raise your trunk (torso) high enough to lift your shoulder blades a tiny bit off the floor. Avoid raising your torso higher than that because it can put too much stress on your low back and does not help to strengthen your abdominal muscles. 6. Slowly return to your starting position. Back lifts Repeat these steps 5-10 times: 1. Lie on your abdomen (face-down) with your arms at your sides, and rest your forehead on the floor. 2. Tighten the muscles in your legs and your buttocks. 3. Slowly lift your chest off the floor while you keep your hips pressed to the floor. Keep the back of your head in line with the curve in your back. Your eyes should be looking at the floor. 4. Hold this position for 3-5 seconds. 5. Slowly return to your starting position. Contact a health care Remell Giaimo if:  Your back pain or discomfort gets much worse when you do an exercise.  Your worsening back pain or discomfort does not lessen within 2 hours after you exercise. If you have any of these problems, stop doing these exercises right away. Do not do them again unless your health care Eniya Cannady says that you can. Get help right away if:  You develop sudden, severe back pain. If this happens, stop doing the exercises right away. Do not do them again unless your health care Antwion Carpenter says that you can. This information is not intended to replace advice given to you by your health care Azekiel Cremer. Make sure you discuss any questions you have with your health care Richerd Grime. Document Revised: 08/23/2018 Document  Reviewed: 01/18/2018 Elsevier Patient Education  [employer].   How to Perform the Epley Maneuver The Epley maneuver is an exercise that relieves symptoms of vertigo. Vertigo is the feeling that you or your surroundings are moving when they are not. When you feel vertigo, you may feel like the room is spinning and have trouble walking. Dizziness is a little different than vertigo. When you are dizzy, you may feel unsteady or light-headed. You can do this maneuver at home whenever you have symptoms of vertigo. You can do it up to 3 times a day until your symptoms go away. Even though the Epley maneuver may relieve your vertigo for a few weeks, it is possible that your symptoms will return. This maneuver relieves vertigo, but it does not relieve dizziness. What are the risks? If it is done correctly,  the Epley maneuver is considered safe. Sometimes it can lead to dizziness or nausea that goes away after a short time. If you develop other symptoms, such as changes in vision, weakness, or numbness, stop doing the maneuver and call your health care Katalin Colledge. How to perform the Epley maneuver 1. Sit on the edge of a bed or table with your back straight and your legs extended or hanging over the edge of the bed or table. 2. Turn your head halfway toward the affected ear or side. 3. Lie backward quickly with your head turned until you are lying flat on your back. You may want to position a pillow under your shoulders. 4. Hold this position for 30 seconds. You may experience an attack of vertigo. This is normal. 5. Turn your head to the opposite direction until your unaffected ear is facing the floor. 6. Hold this position for 30 seconds. You may experience an attack of vertigo. This is normal. Hold this position until the vertigo stops. 7. Turn your whole body to the same side as your head. Hold for another 30 seconds. 8. Sit back up. You can repeat this exercise up to 3 times a day. Follow these  instructions at home:  After doing the Epley maneuver, you can return to your normal activities.  Ask your health care Elois Averitt if there is anything you should do at home to prevent vertigo. He or she may recommend that you: ? Keep your head raised (elevated) with two or more pillows while you sleep. ? Do not sleep on the side of your affected ear. ? Get up slowly from bed. ? Avoid sudden movements during the day. ? Avoid extreme head movement, like looking up or bending over. Contact a health care Kaijah Abts if:  Your vertigo gets worse.  You have other symptoms, including: ? Nausea. ? Vomiting. ? Headache. Get help right away if:  You have vision changes.  You have a severe or worsening headache or neck pain.  You cannot stop vomiting.  You have new numbness or weakness in any part of your body. Summary  Vertigo is the feeling that you or your surroundings are moving when they are not.  The Epley maneuver is an exercise that relieves symptoms of vertigo.  If the Epley maneuver is done correctly, it is considered safe. You can do it up to 3 times a day. This information is not intended to replace advice given to you by your health care Wilmina Maxham. Make sure you discuss any questions you have with your health care Jamauri Kruzel. Document Revised: 03/31/2017 Document Reviewed: 03/08/2016 Elsevier Patient Education  2020 [employer].

## 2019-09-25 LAB — GC/CHLAMYDIA PROBE AMP
Chlamydia trachomatis, NAA: NEGATIVE
Neisseria Gonorrhoeae by PCR: NEGATIVE

## 2019-09-26 ENCOUNTER — Ambulatory Visit: Payer: Self-pay | Admitting: Nurse Practitioner

## 2019-09-26 LAB — COMPREHENSIVE METABOLIC PANEL
ALT: 18 IU/L (ref 0–44)
AST: 21 IU/L (ref 0–40)
Albumin/Globulin Ratio: 1.9 (ref 1.2–2.2)
Albumin: 4.6 g/dL (ref 4.0–5.0)
Alkaline Phosphatase: 68 IU/L (ref 48–121)
BUN/Creatinine Ratio: 16 (ref 9–20)
BUN: 17 mg/dL (ref 6–20)
Bilirubin Total: 0.5 mg/dL (ref 0.0–1.2)
CO2: 21 mmol/L (ref 20–29)
Calcium: 9.6 mg/dL (ref 8.7–10.2)
Chloride: 104 mmol/L (ref 96–106)
Creatinine, Ser: 1.07 mg/dL (ref 0.76–1.27)
GFR calc Af Amer: 101 mL/min/{1.73_m2} (ref >59)
GFR calc non Af Amer: 88 mL/min/{1.73_m2} (ref >59)
Globulin, Total: 2.4 g/dL (ref 1.5–4.5)
Glucose: 86 mg/dL (ref 65–99)
Potassium: 4.3 mmol/L (ref 3.5–5.2)
Sodium: 141 mmol/L (ref 134–144)
Total Protein: 7 g/dL (ref 6.0–8.5)

## 2019-09-26 LAB — HSV(HERPES SMPLX)ABS-I+II(IGG+IGM)-BLD
HSV 1 Glycoprotein G Ab, IgG: <0.91 index (ref 0.00–0.90)
HSV 2 IgG, Type Spec: <0.91 index (ref 0.00–0.90)
HSVI/II Comb IgM: <0.91 Ratio (ref 0.00–0.90)

## 2019-09-26 LAB — CBC WITH DIFFERENTIAL/PLATELET
Basophils Absolute: 0 10*3/uL (ref 0.0–0.2)
Basos: 0 %
EOS (ABSOLUTE): 0.2 10*3/uL (ref 0.0–0.4)
Eos: 4 %
Hematocrit: 46.3 % (ref 37.5–51.0)
Hemoglobin: 15.2 g/dL (ref 13.0–17.7)
Immature Grans (Abs): 0 10*3/uL (ref 0.0–0.1)
Immature Granulocytes: 0 %
Lymphocytes Absolute: 1.7 10*3/uL (ref 0.7–3.1)
Lymphs: 30 %
MCH: 30.7 pg (ref 26.6–33.0)
MCHC: 32.8 g/dL (ref 31.5–35.7)
MCV: 94 fL (ref 79–97)
Monocytes Absolute: 0.6 10*3/uL (ref 0.1–0.9)
Monocytes: 10 %
Neutrophils Absolute: 3.2 10*3/uL (ref 1.4–7.0)
Neutrophils: 56 %
Platelets: 161 10*3/uL (ref 150–450)
RBC: 4.95 x10E6/uL (ref 4.14–5.80)
RDW: 13 % (ref 11.6–15.4)
WBC: 5.8 10*3/uL (ref 3.4–10.8)

## 2019-09-26 LAB — IRON,TIBC AND FERRITIN PANEL
Ferritin: 98 ng/mL (ref 30–400)
Iron Saturation: 28 % (ref 15–55)
Iron: 84 ug/dL (ref 38–169)
Total Iron Binding Capacity: 304 ug/dL (ref 250–450)
UIBC: 220 ug/dL (ref 111–343)

## 2019-09-26 LAB — RPR: RPR Ser Ql: NONREACTIVE

## 2019-10-24 ENCOUNTER — Other Ambulatory Visit: Payer: Self-pay

## 2019-10-24 ENCOUNTER — Encounter: Payer: Self-pay | Admitting: Family Medicine

## 2019-10-24 ENCOUNTER — Ambulatory Visit
Admission: RE | Admit: 2019-10-24 | Discharge: 2019-10-24 | Disposition: A | Discharge status: Home / Self Care | Payer: 59 | Source: Ambulatory Visit | Attending: Family Medicine | Admitting: Family Medicine

## 2019-10-24 ENCOUNTER — Ambulatory Visit (INDEPENDENT_AMBULATORY_CARE_PROVIDER_SITE_OTHER): Payer: 59 | Admitting: Family Medicine

## 2019-10-24 VITALS — BP 125/76 | HR 63 | Temp 98.2°F | Ht 73.0 in | Wt 191.0 lb

## 2019-10-24 DIAGNOSIS — M5441 Lumbago with sciatica, right side: Secondary | ICD-10-CM | POA: Insufficient documentation

## 2019-10-24 DIAGNOSIS — F330 Major depressive disorder, recurrent, mild: Secondary | ICD-10-CM | POA: Diagnosis not present

## 2019-10-24 DIAGNOSIS — Z136 Encounter for screening for cardiovascular disorders: Secondary | ICD-10-CM

## 2019-10-24 DIAGNOSIS — Z8616 Personal history of COVID-19: Secondary | ICD-10-CM

## 2019-10-24 DIAGNOSIS — Z0000 Encounter for general adult medical examination without abnormal findings: Secondary | ICD-10-CM

## 2019-10-24 DIAGNOSIS — Z8719 Personal history of other diseases of the digestive system: Secondary | ICD-10-CM

## 2019-10-24 DIAGNOSIS — M5442 Lumbago with sciatica, left side: Secondary | ICD-10-CM | POA: Diagnosis present

## 2019-10-24 DIAGNOSIS — G8929 Other chronic pain: Secondary | ICD-10-CM | POA: Diagnosis present

## 2019-10-24 DIAGNOSIS — Z8711 Personal history of peptic ulcer disease: Secondary | ICD-10-CM

## 2019-10-24 DIAGNOSIS — Z1159 Encounter for screening for other viral diseases: Secondary | ICD-10-CM | POA: Diagnosis not present

## 2019-10-24 DIAGNOSIS — L989 Disorder of the skin and subcutaneous tissue, unspecified: Secondary | ICD-10-CM | POA: Diagnosis not present

## 2019-10-24 LAB — UA/M W/RFLX CULTURE, ROUTINE
Bilirubin, UA: NEGATIVE
Glucose, UA: NEGATIVE
Ketones, UA: NEGATIVE
Leukocytes,UA: NEGATIVE
Nitrite, UA: NEGATIVE
Protein,UA: NEGATIVE
RBC, UA: NEGATIVE
Specific Gravity, UA: 1.025 (ref 1.005–1.030)
Urobilinogen, Ur: 0.2 mg/dL (ref 0.2–1.0)
pH, UA: 5.5 (ref 5.0–7.5)

## 2019-10-24 MED ORDER — METHOCARBAMOL 500 MG PO TABS: 500.0000 mg | ORAL_TABLET | Freq: Three times a day (TID) | ORAL | 0 refills | Status: DC | PRN

## 2019-10-24 NOTE — Progress Notes (Signed)
BP 125/76   Pulse 63   Temp 98.2 F (36.8 C) (Oral)   Ht 6\' 1"  (1.854 m)   Wt 191 lb (86.6 kg)   SpO2 100%   BMI 25.20 kg/m    Subjective:    Patient ID: Joel Mills, male    DOB: 1981/03/03, 39 y.o.   MRN: [id number]  HPI: Joel Mills is a 39 y.o. male presenting on 10/24/2019 for comprehensive medical examination. Current medical complaints include:see below  Hx of mild depression, and going through a stressful time right now which has accentuated things. Does feel like it's an issue and affects his daily life but not sure he's ready to be on medication for these issues. Open to considering a counselor. Denies SI/HI. Mostly feels anhedonia, lack of motivation, depressed moods.   Had a bleeding ulcer over a year ago from too many NSAIDs for his back issues. Avoids them now and opts for tylenol if he's to use something OTC. His chronic back pain has been present for at least 6 years per patient, worsening over time. Previously the pain was mostly down left leg but now travels down both legs. 9/10 pain by end of day. Intermittent numbness and tingling toward feet. Had a spinal injection several years ago but had a vasovagal syncopal episode with it and was hospitalized for a short time after this event. Flexeril causes irritability in the past so does not take muscle relaxers. No known inciting injury initially with his back pain.   Several skin lesions he thinks may be growing on his right calf. No known injury to area, no known hx of skin cancers. Not bleeding, itchy, painful.   Depression screen [facility] 2/9 10/24/2019 09/24/2019  Decreased Interest 2 0  Down, Depressed, Hopeless 2 1  PHQ - 2 Score 4 1  Altered sleeping 2 -  Tired, decreased energy 2 -  Change in appetite 1 -  Feeling bad or failure about yourself  2 -  Trouble concentrating 1 -  Moving slowly or fidgety/restless 1 -  Suicidal thoughts 0 -  PHQ-9 Score 13 -   GAD 7 : Generalized Anxiety Score 10/24/2019  Nervous,  Anxious, on Edge 2  Control/stop worrying 0  Worry too much - different things 1  Trouble relaxing 2  Restless 3  Easily annoyed or irritable 2  Afraid - awful might happen 0  Total GAD 7 Score 10  Anxiety Difficulty Somewhat difficult     He currently lives with: Interim Problems from his last visit: yes  Depression Screen done today and results listed below:  Depression screen [facility] 2/9 10/24/2019 09/24/2019  Decreased Interest 2 0  Down, Depressed, Hopeless 2 1  PHQ - 2 Score 4 1  Altered sleeping 2 -  Tired, decreased energy 2 -  Change in appetite 1 -  Feeling bad or failure about yourself  2 -  Trouble concentrating 1 -  Moving slowly or fidgety/restless 1 -  Suicidal thoughts 0 -  PHQ-9 Score 13 -    The patient does not have a history of falls. I did complete a risk assessment for falls. A plan of care for falls was documented.   Past Medical History:  Past Medical History:  Diagnosis Date  . Anemia    39 years old  . Asthma   . Depression     Surgical History:  Past Surgical History:  Procedure Laterality Date  . ADENOIDECTOMY    . ESOPHAGOGASTRODUODENOSCOPY (EGD) WITH PROPOFOL N/A  01/07/2018   Procedure: ESOPHAGOGASTRODUODENOSCOPY (EGD) WITH PROPOFOL;  Surgeon: Toledo, Boykin Nearing, MD;  Location: [facility] ENDOSCOPY;  Service: Gastroenterology;  Laterality: N/A;  . VASECTOMY      Medications:  Current Outpatient Medications on File Prior to Visit  Medication Sig  . [DISCONTINUED] pantoprazole (PROTONIX) 40 MG tablet Take 1 tablet (40 mg total) by mouth 2 (two) times daily before a meal.   No current facility-administered medications on file prior to visit.    Allergies:  No Known Allergies  Social History:  Social History   Socioeconomic History  . Marital status: Married    Spouse name: Not on file  . Number of children: 2  . Years of education: Not on file  . Highest education level: Not on file  Occupational History  . Occupation: Product manager  Tobacco Use  . Smoking status: Former Smoker    Types: Cigarettes    Quit date: 11/30/2018    Years since quitting: 0.9  . Smokeless tobacco: Never Used  Vaping Use  . Vaping Use: Never used  Substance and Sexual Activity  . Alcohol use: Yes    Comment: socially  . Drug use: Yes    Types: Marijuana  . Sexual activity: Yes  Other Topics Concern  . Not on file  Social History Narrative  . Not on file   Social Determinants of Health   Financial Resource Strain:   . Difficulty of Paying Living Expenses:   Food Insecurity:   . Worried About Programme researcher, broadcasting/film/video in the Last Year:   . Barista in the Last Year:   Transportation Needs:   . Freight forwarder (Medical):   [name] Lack of Transportation (Non-Medical):   Physical Activity:   . Days of Exercise per Week:   . Minutes of Exercise per Session:   Stress:   . Feeling of Stress :   Social Connections:   . Frequency of Communication with Friends and Family:   . Frequency of Social Gatherings with Friends and Family:   . Attends Religious Services:   . Active Member of Clubs or Organizations:   . Attends Banker Meetings:   [name] Marital Status:   Intimate Partner Violence:   . Fear of Current or Ex-Partner:   . Emotionally Abused:   [name] Physically Abused:   . Sexually Abused:    Social History   Tobacco Use  Smoking Status Former Smoker  . Types: Cigarettes  . Quit date: 11/30/2018  . Years since quitting: 0.9  Smokeless Tobacco Never Used   Social History   Substance and Sexual Activity  Alcohol Use Yes   Comment: socially    Family History:  Family History  Problem Relation Age of Onset  . Diabetes Mother   . Hypertension Father   . Depression Father   . Cancer Sister   . Depression Sister     Past medical history, surgical history, medications, allergies, family history and social history reviewed with patient today and changes made to appropriate areas of the chart.    Review of Systems - General ROS: negative Psychological ROS: positive for - depression Ophthalmic ROS: negative ENT ROS: negative Allergy and Immunology ROS: negative Hematological and Lymphatic ROS: negative Endocrine ROS: negative Breast ROS: negative for breast lumps Respiratory ROS: no cough, shortness of breath, or wheezing Cardiovascular ROS: no chest pain or dyspnea on exertion Gastrointestinal ROS: no abdominal pain, change in bowel habits, or black or bloody stools Genito-Urinary  ROS: no dysuria, trouble voiding, or hematuria Musculoskeletal ROS: positive for - joint pain Neurological ROS: positive for - numbness/tingling Dermatological ROS: positive for skin lesion changes All other ROS negative except what is listed above and in the HPI.      Objective:    BP 125/76   Pulse 63   Temp 98.2 F (36.8 C) (Oral)   Ht 6\' 1"  (1.854 m)   Wt 191 lb (86.6 kg)   SpO2 100%   BMI 25.20 kg/m   Wt Readings from Last 3 Encounters:  10/24/19 191 lb (86.6 kg)  09/24/19 192 lb 9.6 oz (87.4 kg)  07/23/19 195 lb (88.5 kg)    Physical Exam Vitals and nursing note reviewed.  Constitutional:      General: He is not in acute distress.    Appearance: He is well-developed.  HENT:     Head: Atraumatic.     Right Ear: Tympanic membrane and external ear normal.     Left Ear: Tympanic membrane and external ear normal.     Nose: Nose normal.     Mouth/Throat:     Mouth: Mucous membranes are moist.     Pharynx: Oropharynx is clear.  Eyes:     General: No scleral icterus.    Conjunctiva/sclera: Conjunctivae normal.     Pupils: Pupils are equal, round, and reactive to light.  Cardiovascular:     Rate and Rhythm: Normal rate and regular rhythm.     Heart sounds: Normal heart sounds. No murmur heard.   Pulmonary:     Effort: Pulmonary effort is normal. No respiratory distress.     Breath sounds: Normal breath sounds.  Abdominal:     General: Bowel sounds are normal. There is no  distension.     Palpations: Abdomen is soft. There is no mass.     Tenderness: There is no abdominal tenderness. There is no guarding.  Genitourinary:    Comments: GU exam declined with shared decision making Musculoskeletal:        General: No tenderness. Normal range of motion.     Cervical back: Normal range of motion and neck supple.     Comments: - SLR b/l Low back nontender to palpation diffusely  Skin:    General: Skin is warm and dry.     Findings: No rash.     Comments: Small hyperpigmented thickened lesions on posterior right calf  Neurological:     General: No focal deficit present.     Mental Status: He is alert and oriented to person, place, and time.     Deep Tendon Reflexes: Reflexes are normal and symmetric.  Psychiatric:        Mood and Affect: Mood normal.        Behavior: Behavior normal.        Thought Content: Thought content normal.        Judgment: Judgment normal.     Results for orders placed or performed in visit on 10/24/19  Hepatitis C antibody  Result Value Ref Range   Hep C Virus Ab <0.1 0.0 - 0.9 s/co ratio  CBC with Differential/Platelet  Result Value Ref Range   WBC 5.9 3.4 - 10.8 x10E3/uL   RBC 4.96 4.14 - 5.80 x10E6/uL   Hemoglobin 15.8 13.0 - 17.7 g/dL   Hematocrit 16.147.3 09.637.5 - 51.0 %   MCV 95 79 - 97 fL   MCH 31.9 26.6 - 33.0 pg   MCHC 33.4 31 - 35 g/dL  RDW 13.3 11.6 - 15.4 %   Platelets 172 150 - 450 x10E3/uL   Neutrophils 54 Not Estab. %   Lymphs 29 Not Estab. %   Monocytes 12 Not Estab. %   Eos 4 Not Estab. %   Basos 1 Not Estab. %   Neutrophils Absolute 3.2 1 - 7 x10E3/uL   Lymphocytes Absolute 1.7 0 - 3 x10E3/uL   Monocytes Absolute 0.7 0 - 0 x10E3/uL   EOS (ABSOLUTE) 0.2 0.0 - 0.4 x10E3/uL   Basophils Absolute 0.0 0 - 0 x10E3/uL   Immature Granulocytes 0 Not Estab. %   Immature Grans (Abs) 0.0 0.0 - 0.1 x10E3/uL  Comprehensive metabolic panel  Result Value Ref Range   Glucose 61 (L) 65 - 99 mg/dL   BUN 14 6 - 20 mg/dL    Creatinine, Ser 7.20 0.76 - 1.27 mg/dL   GFR calc non Af Amer 89 >59 mL/min/1.73   GFR calc Af Amer 102 >59 mL/min/1.73   BUN/Creatinine Ratio 13 9 - 20   Sodium 139 134 - 144 mmol/L   Potassium 4.2 3.5 - 5.2 mmol/L   Chloride 100 96 - 106 mmol/L   CO2 24 20 - 29 mmol/L   Calcium 9.3 8.7 - 10.2 mg/dL   Total Protein 7.3 6.0 - 8.5 g/dL   Albumin 4.8 4.0 - 5.0 g/dL   Globulin, Total 2.5 1.5 - 4.5 g/dL   Albumin/Globulin Ratio 1.9 1.2 - 2.2   Bilirubin Total 0.5 0.0 - 1.2 mg/dL   Alkaline Phosphatase 75 48 - 121 IU/L   AST 19 0 - 40 IU/L   ALT 19 0 - 44 IU/L  Lipid Panel w/o Chol/HDL Ratio  Result Value Ref Range   Cholesterol, Total 146 100 - 199 mg/dL   Triglycerides 947 0 - 149 mg/dL   HDL 31 (L) >09 mg/dL   VLDL Cholesterol Cal 25 5 - 40 mg/dL   LDL Chol Calc (NIH) 90 0 - 99 mg/dL  TSH  Result Value Ref Range   TSH 1.560 0.450 - 4.500 uIU/mL  UA/M w/rflx Culture, Routine   Specimen: Urine   Urine  Result Value Ref Range   Specific Gravity, UA 1.025 1.005 - 1.030   pH, UA 5.5 5.0 - 7.5   Color, UA Yellow Yellow   Appearance Ur Clear Clear   Leukocytes,UA Negative Negative   Protein,UA Negative Negative/Trace   Glucose, UA Negative Negative   Ketones, UA Negative Negative   RBC, UA Negative Negative   Bilirubin, UA Negative Negative   Urobilinogen, Ur 0.2 0.2 - 1.0 mg/dL   Nitrite, UA Negative Negative  SAR CoV2 Serology (COVID 19)AB(IGG)IA  Result Value Ref Range   DiaSorin SARS-CoV-2 Ab, IgG Negative Negative      Assessment & Plan:   Problem List Items Addressed This Visit      Nervous and Auditory   Chronic bilateral low back pain with sciatica - Primary    Given duration and worsening course, will obtain lumbar spine x-rays, trial robaxin and refer to Neurosurgery for further evaluation      Relevant Medications   methocarbamol (ROBAXIN) 500 MG tablet   Other Relevant Orders   DG Lumbar Spine Complete (Completed)   Ambulatory referral to  Neurosurgery     Other   History of gastric ulcer    Continue avoidance of NSAIDs, if taking occasional NSAIDs discussed using PPI to help protect stomach lining.       Depression    Not interested  in medications at this time, given list of counselors to review and pt to let us know if unable to find someone to schedule with        Other Visit Diagnoses    Annual physical exam       Relevant Orders   CBC with Differential/Platelet (Completed)   Comprehensive metabolic panel (Completed)   TSH (Completed)   UA/M w/rflx Culture, Routine (Completed)   Skin lesion       Consistent with dermatofibromas, reassurance given. Conitnue to monitor   History of COVID-19       Requesting antibody labs. Ordered today   Relevant Orders   SAR CoV2 Serology (COVID 19)AB(IGG)IA (Completed)   Need for hepatitis C screening test       Relevant Orders   Hepatitis C antibody (Completed)   Screening for cardiovascular condition       Relevant Orders   Lipid Panel w/o Chol/HDL Ratio (Completed)       Discussed aspirin prophylaxis for myocardial infarction prevention and decision was it was not indicated  LABORATORY TESTING:  Health maintenance labs ordered today as discussed above.   The natural history of prostate cancer and ongoing controversy regarding screening and potential treatment outcomes of prostate cancer has been discussed with the patient. The meaning of a false positive PSA and a false negative PSA has been discussed. He indicates understanding of the limitations of this screening test and wishes not to proceed with screening PSA testing.   IMMUNIZATIONS:   - Tdap: Tetanus vaccination status reviewed: last tetanus booster within 10 years. - Influenza: Postponed to flu season  PATIENT COUNSELING:    Sexuality: Discussed sexually transmitted diseases, partner selection, use of condoms, avoidance of unintended pregnancy  and contraceptive alternatives.   Advised to avoid cigarette  smoking.  I discussed with the patient that most people either abstain from alcohol or drink within safe limits (<=14/week and <=4 drinks/occasion for males, <=7/weeks and <= 3 drinks/occasion for females) and that the risk for alcohol disorders and other health effects rises proportionally with the number of drinks per week and how often a drinker exceeds daily limits.  Discussed cessation/primary prevention of drug use and availability of treatment for abuse.   Diet: Encouraged to adjust caloric intake to maintain  or achieve ideal body weight, to reduce intake of dietary saturated fat and total fat, to limit sodium intake by avoiding high sodium foods and not adding table salt, and to maintain adequate dietary potassium and calcium preferably from fresh fruits, vegetables, and low-fat dairy products.    stressed the importance of regular exercise  Injury prevention: Discussed safety belts, safety helmets, smoke detector, smoking near bedding or upholstery.   Dental health: Discussed importance of regular tooth brushing, flossing, and dental visits.   Follow up plan: NEXT PREVENTATIVE PHYSICAL DUE IN 1 YEAR. Return in about 1 year (around 10/23/2020) for CPE.

## 2019-10-24 NOTE — Patient Instructions (Signed)
[employer] - type in zip code and health insurance

## 2019-10-25 LAB — HEPATITIS C ANTIBODY: Hep C Virus Ab: <0.1 s/co ratio (ref 0.0–0.9)

## 2019-10-25 LAB — COMPREHENSIVE METABOLIC PANEL
ALT: 19 IU/L (ref 0–44)
AST: 19 IU/L (ref 0–40)
Albumin/Globulin Ratio: 1.9 (ref 1.2–2.2)
Albumin: 4.8 g/dL (ref 4.0–5.0)
Alkaline Phosphatase: 75 IU/L (ref 48–121)
BUN/Creatinine Ratio: 13 (ref 9–20)
BUN: 14 mg/dL (ref 6–20)
Bilirubin Total: 0.5 mg/dL (ref 0.0–1.2)
CO2: 24 mmol/L (ref 20–29)
Calcium: 9.3 mg/dL (ref 8.7–10.2)
Chloride: 100 mmol/L (ref 96–106)
Creatinine, Ser: 1.06 mg/dL (ref 0.76–1.27)
GFR calc Af Amer: 102 mL/min/{1.73_m2} (ref >59)
GFR calc non Af Amer: 89 mL/min/{1.73_m2} (ref >59)
Globulin, Total: 2.5 g/dL (ref 1.5–4.5)
Glucose: 61 mg/dL — ABNORMAL LOW (ref 65–99)
Potassium: 4.2 mmol/L (ref 3.5–5.2)
Sodium: 139 mmol/L (ref 134–144)
Total Protein: 7.3 g/dL (ref 6.0–8.5)

## 2019-10-25 LAB — LIPID PANEL W/O CHOL/HDL RATIO
Cholesterol, Total: 146 mg/dL (ref 100–199)
HDL: 31 mg/dL — ABNORMAL LOW (ref >39)
LDL Chol Calc (NIH): 90 mg/dL (ref 0–99)
Triglycerides: 139 mg/dL (ref 0–149)
VLDL Cholesterol Cal: 25 mg/dL (ref 5–40)

## 2019-10-25 LAB — CBC WITH DIFFERENTIAL/PLATELET
Basophils Absolute: 0 10*3/uL (ref 0.0–0.2)
Basos: 1 %
EOS (ABSOLUTE): 0.2 10*3/uL (ref 0.0–0.4)
Eos: 4 %
Hematocrit: 47.3 % (ref 37.5–51.0)
Hemoglobin: 15.8 g/dL (ref 13.0–17.7)
Immature Grans (Abs): 0 10*3/uL (ref 0.0–0.1)
Immature Granulocytes: 0 %
Lymphocytes Absolute: 1.7 10*3/uL (ref 0.7–3.1)
Lymphs: 29 %
MCH: 31.9 pg (ref 26.6–33.0)
MCHC: 33.4 g/dL (ref 31.5–35.7)
MCV: 95 fL (ref 79–97)
Monocytes Absolute: 0.7 10*3/uL (ref 0.1–0.9)
Monocytes: 12 %
Neutrophils Absolute: 3.2 10*3/uL (ref 1.4–7.0)
Neutrophils: 54 %
Platelets: 172 10*3/uL (ref 150–450)
RBC: 4.96 x10E6/uL (ref 4.14–5.80)
RDW: 13.3 % (ref 11.6–15.4)
WBC: 5.9 10*3/uL (ref 3.4–10.8)

## 2019-10-25 LAB — SAR COV2 SEROLOGY (COVID19)AB(IGG),IA: DiaSorin SARS-CoV-2 Ab, IgG: NEGATIVE

## 2019-10-25 LAB — TSH: TSH: 1.56 u[IU]/mL (ref 0.450–4.500)

## 2019-10-27 DIAGNOSIS — Z8711 Personal history of peptic ulcer disease: Secondary | ICD-10-CM | POA: Insufficient documentation

## 2019-10-27 DIAGNOSIS — F32A Depression, unspecified: Secondary | ICD-10-CM | POA: Insufficient documentation

## 2019-10-27 NOTE — Assessment & Plan Note (Signed)
Continue avoidance of NSAIDs, if taking occasional NSAIDs discussed using PPI to help protect stomach lining.

## 2019-10-27 NOTE — Assessment & Plan Note (Signed)
Not interested in medications at this time, given list of counselors to review and pt to let us know if unable to find someone to schedule with

## 2019-10-27 NOTE — Assessment & Plan Note (Signed)
Given duration and worsening course, will obtain lumbar spine x-rays, trial robaxin and refer to Neurosurgery for further evaluation

## 2019-11-04 ENCOUNTER — Other Ambulatory Visit: Payer: Self-pay

## 2019-11-04 ENCOUNTER — Ambulatory Visit
Admission: RE | Admit: 2019-11-04 | Discharge: 2019-11-04 | Disposition: A | Discharge status: Home / Self Care | Payer: 59 | Source: Ambulatory Visit | Attending: Emergency Medicine | Admitting: Emergency Medicine

## 2019-11-04 VITALS — BP 147/97 | HR 72 | Temp 98.4°F | Resp 18 | Ht 74.0 in | Wt 195.0 lb

## 2019-11-04 DIAGNOSIS — J069 Acute upper respiratory infection, unspecified: Secondary | ICD-10-CM | POA: Insufficient documentation

## 2019-11-04 DIAGNOSIS — R05 Cough: Secondary | ICD-10-CM | POA: Diagnosis not present

## 2019-11-04 DIAGNOSIS — J029 Acute pharyngitis, unspecified: Secondary | ICD-10-CM | POA: Insufficient documentation

## 2019-11-04 DIAGNOSIS — Z20822 Contact with and (suspected) exposure to covid-19: Secondary | ICD-10-CM | POA: Diagnosis not present

## 2019-11-04 DIAGNOSIS — Z87891 Personal history of nicotine dependence: Secondary | ICD-10-CM | POA: Diagnosis not present

## 2019-11-04 DIAGNOSIS — Z8616 Personal history of COVID-19: Secondary | ICD-10-CM | POA: Diagnosis not present

## 2019-11-04 DIAGNOSIS — R067 Sneezing: Secondary | ICD-10-CM | POA: Insufficient documentation

## 2019-11-04 LAB — GROUP A STREP BY PCR: Group A Strep by PCR: NOT DETECTED

## 2019-11-04 NOTE — ED Provider Notes (Signed)
[facility] ____________________________________________  Time seen: Approximately 7:15 PM  I have reviewed the triage vital signs and the nursing notes.   HISTORY  Chief Complaint Sore Throat, Cough, and sneezing   HPI Joel Mills is a 39 y.o. male presenting for evaluation of cough, sore throat and sneezing.  Reports has had a sore throat for approximately 4 days but cough and congestion with sneezing for the last 2 days.  States postnasal drainage irritating throat.  Denies known fevers, has had some chills.  States his daughter had some similar complaints last week.  Denies other known sick contacts.  Does work outside a lot and unsure if allergy is related.  Did take some over-the-counter medication which helps some.  No antipyretics taken prior to arrival.  Denies changes in taste or smell.  Denies vomiting or diarrhea.  Denies current chest pain or shortness of breath.  Previously had Covid in November 2020.   Past Medical History:  Diagnosis Date  . Anemia    39 years old  . Asthma   . Depression     Patient Active Problem List   Diagnosis Date Noted  . History of gastric ulcer 10/27/2019  . Depression   . Dizziness 09/24/2019  . Chronic bilateral low back pain with sciatica 09/24/2019  . Rash 09/24/2019    Past Surgical History:  Procedure Laterality Date  . ADENOIDECTOMY    . ESOPHAGOGASTRODUODENOSCOPY (EGD) WITH PROPOFOL N/A 01/07/2018   Procedure: ESOPHAGOGASTRODUODENOSCOPY (EGD) WITH PROPOFOL;  Surgeon: Toledo, Boykin Nearing, MD;  Location: [facility] ENDOSCOPY;  Service: Gastroenterology;  Laterality: N/A;  . VASECTOMY       No current facility-administered medications for this encounter.  Current Outpatient Medications:  .  methocarbamol (ROBAXIN) 500 MG tablet, Take 1 tablet (500 mg total) by mouth every 8 (eight) hours as needed for muscle spasms., Disp: 30 tablet, Rfl: 0  Allergies Patient has no known allergies.  Family History  Problem  Relation Age of Onset  . Diabetes Mother   . Hypertension Father   . Depression Father   . Cancer Sister   . Depression Sister     Social History Social History   Tobacco Use  . Smoking status: Former Smoker    Types: Cigarettes    Quit date: 11/30/2018    Years since quitting: 0.9  . Smokeless tobacco: Never Used  Vaping Use  . Vaping Use: Never used  Substance Use Topics  . Alcohol use: Yes    Comment: socially  . Drug use: Yes    Frequency: 3.0 times per week    Types: Marijuana    Review of Systems Constitutional: No fever ENT: As above. Cardiovascular: Denies chest pain. Respiratory: Denies shortness of breath. Gastrointestinal: No abdominal pain.   Skin: Negative for rash.   ____________________________________________   PHYSICAL EXAM:  VITAL SIGNS: ED Triage Vitals  Enc Vitals Group     BP 11/04/19 1852 (!) 147/97     Pulse Rate 11/04/19 1852 72     Resp 11/04/19 1852 18     Temp 11/04/19 1852 98.4 F (36.9 C)     Temp Source 11/04/19 1852 Oral     SpO2 11/04/19 1852 100 %     Weight 11/04/19 1853 195 lb (88.5 kg)     Height 11/04/19 1853 6\' 2"  (1.88 m)     Head Circumference --      Peak Flow --      Pain Score 11/04/19 1853 0  Pain Loc --      Pain Edu? --      Excl. in GC? --     Constitutional: Alert and oriented. Well appearing and in no acute distress. Eyes: Conjunctivae are normal. PERRL. EOMI. Head: Atraumatic. No sinus tenderness to palpation. No swelling. No erythema.  Ears: no erythema, normal TMs bilaterally.   Nose:Nasal congestion with clear rhinorrhea  Mouth/Throat: Mucous membranes are moist. Mild pharyngeal erythema. No tonsillar swelling or exudate.  Neck: No stridor.  No cervical spine tenderness to palpation. Hematological/Lymphatic/Immunilogical: No cervical lymphadenopathy. Cardiovascular: Normal rate, regular rhythm. Grossly normal heart sounds.  Good peripheral circulation. Respiratory: Normal respiratory effort.   No retractions. No wheezes, rales or rhonchi. Good air movement.  Musculoskeletal: Ambulatory with steady gait.  Neurologic:  Normal speech and language. No gait instability. Skin:  Skin appears warm, dry and intact. No rash noted. Psychiatric: Mood and affect are normal. Speech and behavior are normal.  ___________________________________________   LABS (all labs ordered are listed, but only abnormal results are displayed)  Labs Reviewed  SARS CORONAVIRUS 2 (TAT 6-24 HRS)  GROUP A STREP BY PCR   ____________________________________________  PROCEDURES   INITIAL IMPRESSION / ASSESSMENT AND PLAN / ED COURSE  Pertinent labs & imaging results that were available during my care of the patient were reviewed by me and considered in my medical decision making (see chart for details).  Well-appearing patient.  No acute distress.  Suspect viral upper respiratory infection.  COVID-19 testing ordered.  Strep negative.  Patient states he has over-the-counter cough congestion medication that he will take as needed.  Encourage rest, fluids, supportive care.  Discussed follow up with Primary care physician this week. Discussed follow up and return parameters including no resolution or any worsening concerns. Patient verbalized understanding and agreed to plan.   ____________________________________________   FINAL CLINICAL IMPRESSION(S) / ED DIAGNOSES  Final diagnoses:  Upper respiratory tract infection, unspecified type  Sore throat     ED Discharge Orders    None       Note: This dictation was prepared with Dragon dictation along with smaller phrase technology. Any transcriptional errors that result from this process are unintentional.         Renford Dills, NP 11/04/19 1945

## 2019-11-04 NOTE — ED Triage Notes (Signed)
Patient in today c/o scratchy throat, cough, sneezing, PND x 4 days. Patient denies fever. Patient has taken OTC cough syrup. Patient has not had the covid vaccine.

## 2019-11-04 NOTE — Discharge Instructions (Signed)
Over-the-counter medication as needed.  Rest. Drink plenty of fluids.  ° °Follow up with your primary care physician this week as needed. Return to Urgent care for new or worsening concerns.  ° °

## 2019-11-05 LAB — SARS CORONAVIRUS 2 (TAT 6-24 HRS): SARS Coronavirus 2: NEGATIVE

## 2019-12-09 ENCOUNTER — Telehealth: Payer: Self-pay | Admitting: Nurse Practitioner

## 2019-12-09 NOTE — Telephone Encounter (Signed)
Copied from CRM [id number]. Topic: General - Other >> Dec 09, 2019  9:17 AM Dalphine Handing A wrote: Pt would like a new referral placed at a [facility] facility instead of the [facility]  referral

## 2019-12-09 NOTE — Telephone Encounter (Signed)
Routing to referral coordinator.

## 2020-01-02 ENCOUNTER — Ambulatory Visit
Admission: EM | Admit: 2020-01-02 | Discharge: 2020-01-02 | Disposition: A | Discharge status: Home / Self Care | Payer: 59

## 2020-01-02 ENCOUNTER — Encounter: Payer: Self-pay | Admitting: Emergency Medicine

## 2020-01-02 ENCOUNTER — Other Ambulatory Visit: Payer: Self-pay

## 2020-01-02 DIAGNOSIS — S20219A Contusion of unspecified front wall of thorax, initial encounter: Secondary | ICD-10-CM

## 2020-01-02 DIAGNOSIS — M542 Cervicalgia: Secondary | ICD-10-CM

## 2020-01-02 DIAGNOSIS — S20212A Contusion of left front wall of thorax, initial encounter: Secondary | ICD-10-CM | POA: Diagnosis not present

## 2020-01-02 DIAGNOSIS — S060X0A Concussion without loss of consciousness, initial encounter: Secondary | ICD-10-CM | POA: Diagnosis not present

## 2020-01-02 DIAGNOSIS — H9313 Tinnitus, bilateral: Secondary | ICD-10-CM
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
## 2020-01-02 NOTE — Discharge Instructions (Signed)
Please limit screen time to 1 hour a day and follow up with your family Dr next week. If you gets worse, please go to the ER.

## 2020-03-24 ENCOUNTER — Ambulatory Visit
Admission: EM | Admit: 2020-03-24 | Discharge: 2020-03-24 | Disposition: A | Discharge status: Home / Self Care | Payer: 59 | Attending: Family Medicine | Admitting: Family Medicine

## 2020-03-24 ENCOUNTER — Ambulatory Visit (INDEPENDENT_AMBULATORY_CARE_PROVIDER_SITE_OTHER): Payer: 59

## 2020-03-24 ENCOUNTER — Encounter: Payer: Self-pay | Admitting: Emergency Medicine

## 2020-03-24 ENCOUNTER — Other Ambulatory Visit: Payer: Self-pay

## 2020-03-24 DIAGNOSIS — M25562 Pain in left knee: Secondary | ICD-10-CM

## 2020-03-24 DIAGNOSIS — M62838 Other muscle spasm: Secondary | ICD-10-CM

## 2020-03-24 DIAGNOSIS — M7042 Prepatellar bursitis, left knee: Secondary | ICD-10-CM

## 2020-03-24 IMAGING — CT CT HEAD W/O CM
4 series · 16 of 47 positions shown, 18 images · non-contrast
Comparison: None.

CLINICAL DATA: Chest pain and vertigo.

EXAM:
CT HEAD WITHOUT CONTRAST
TECHNIQUE: Contiguous axial images were obtained from the base of the skull
through the vertex without intravenous contrast.

[Series 2: head wo · axial · 0.42mm/px · z∈[-63,+42]mm · 7 of 29 slices shown, 9 images]
[im 4/29  brain]
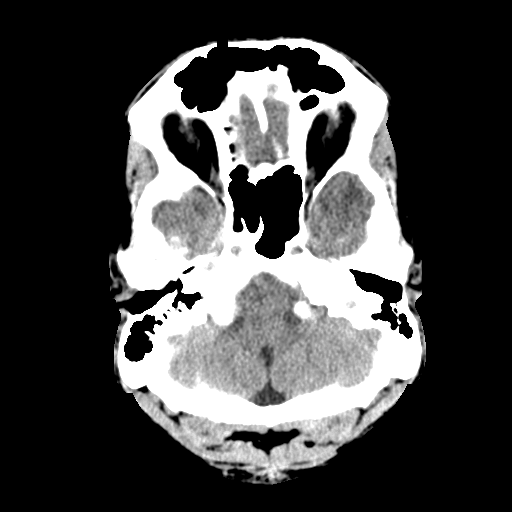
[im 4/29  bone]
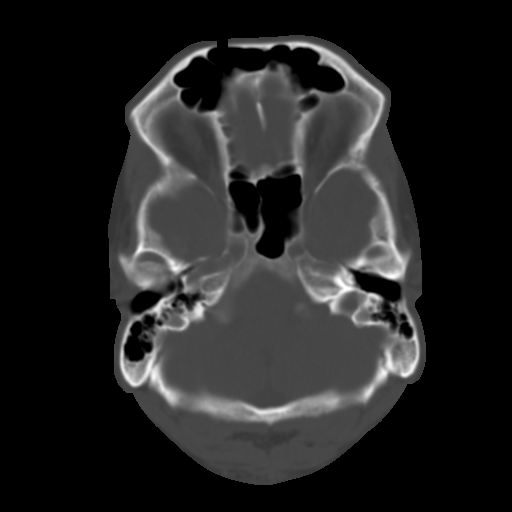
[im 8/29  brain]
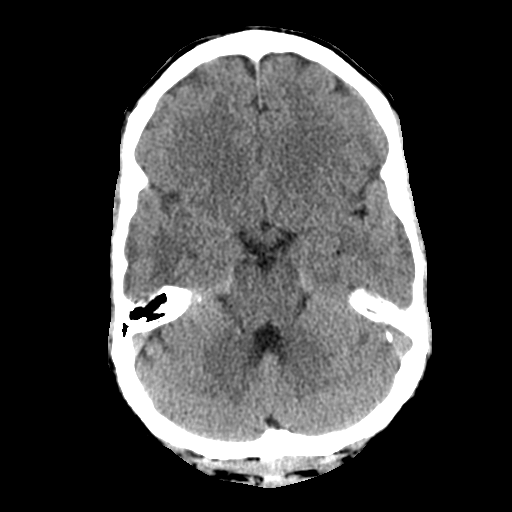
[im 11/29  brain]
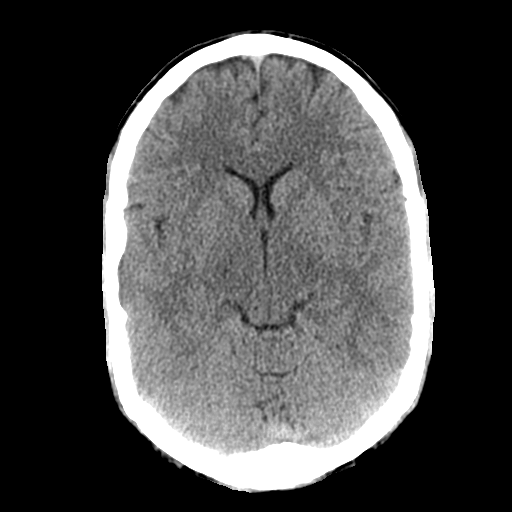
[im 15/29  brain]
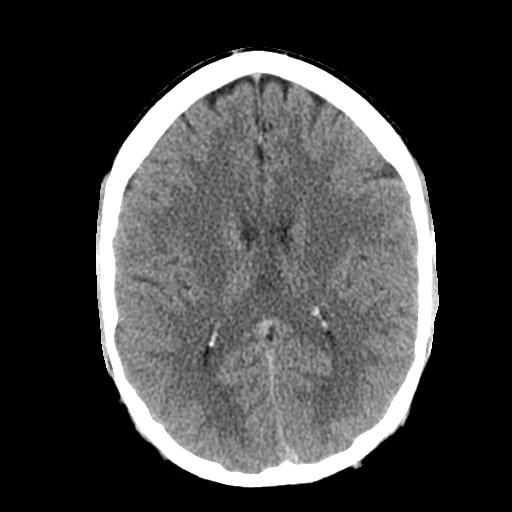
[im 18/29  brain]
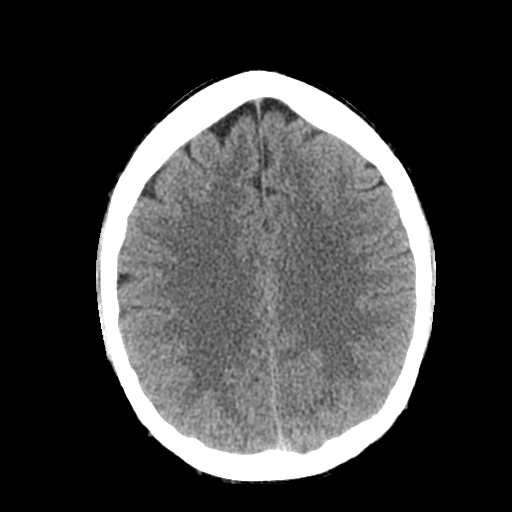
[im 18/29  bone]
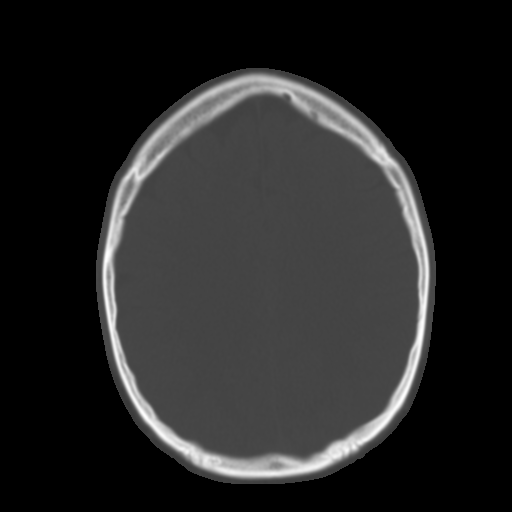
[im 22/29  brain]
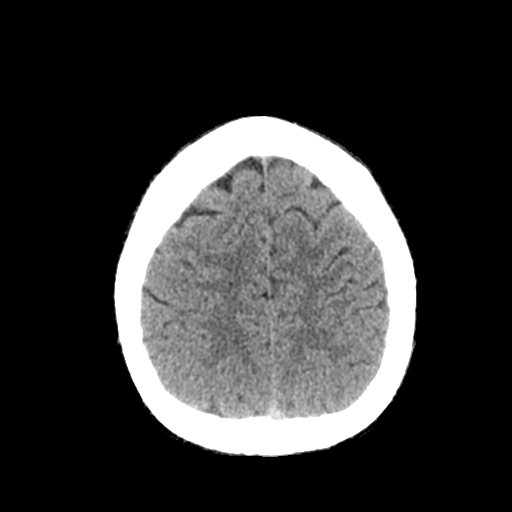
[im 25/29  brain]
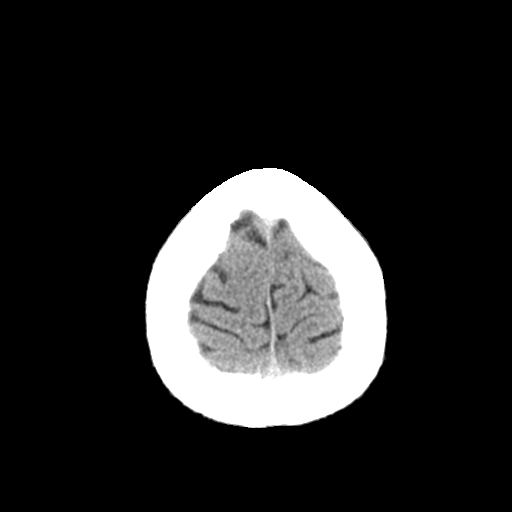

[Series 3: head bone · axial · 0.42mm/px · z∈[-64,-36]mm · 3 of 73 slices shown]
[im 8/73  bone]
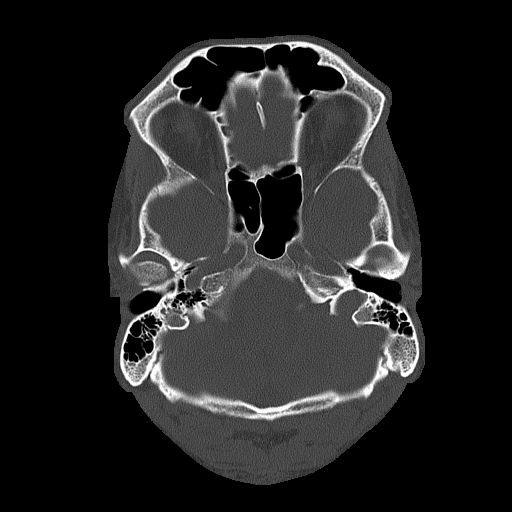
[im 15/73  bone]
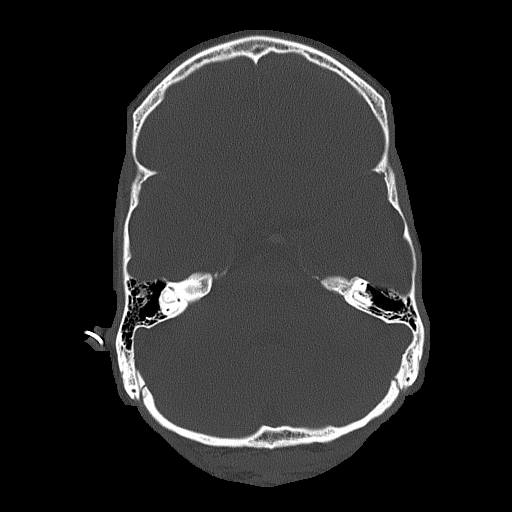
[im 22/73  bone]
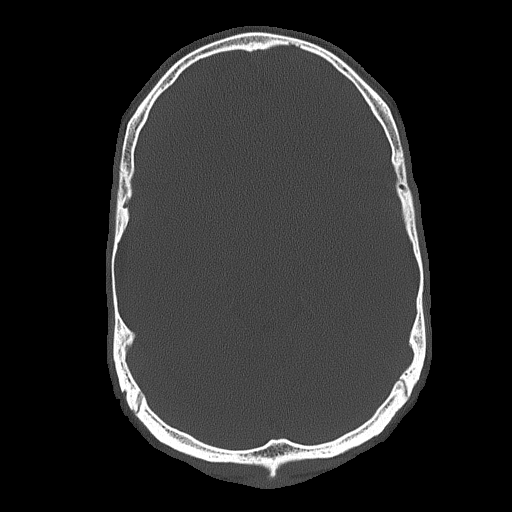

[Series 4: coronal soft tissue · coronal · 0.32mm/px · 3 of 65 slices shown]
[im 22/65  brain]
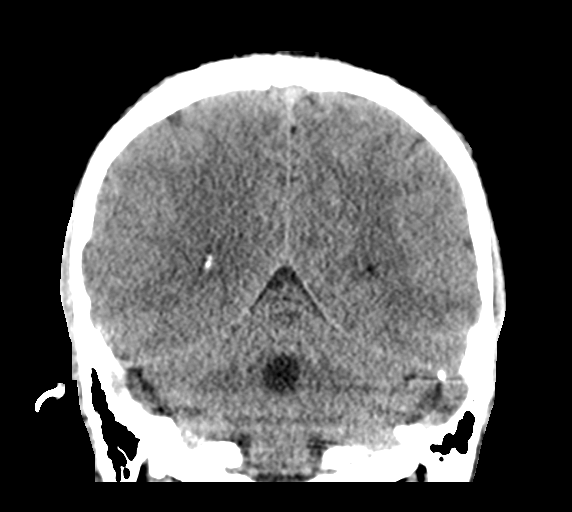
[im 29/65  brain]
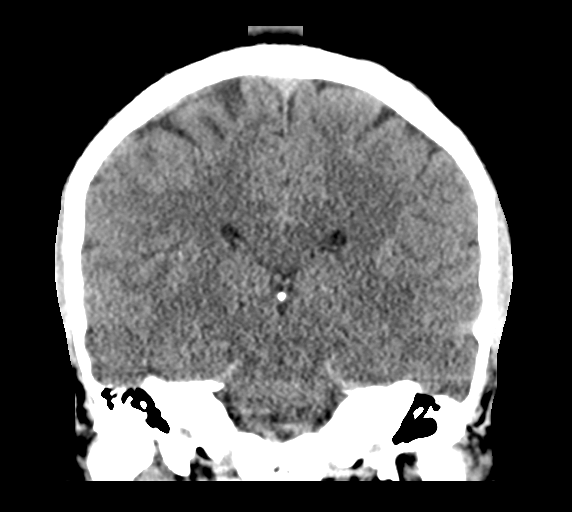
[im 36/65  brain]
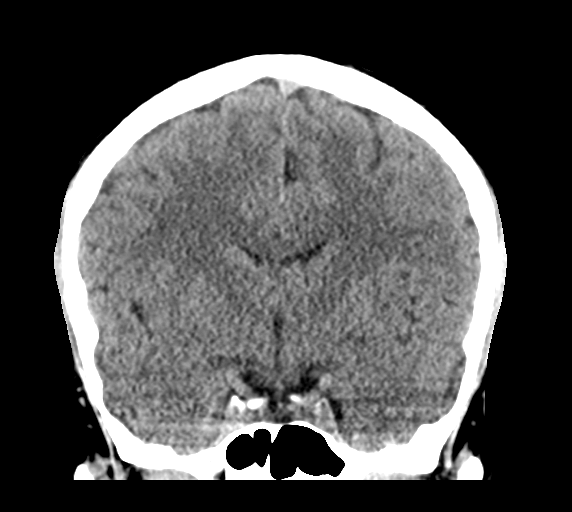

[Series 5: sagittal soft tissue · sagittal · 0.30mm/px · 3 of 52 slices shown]
[im 18/52  brain]
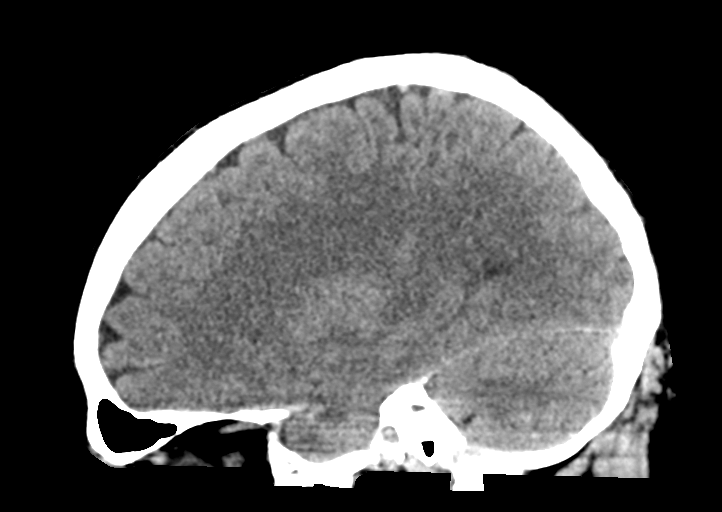
[im 26/52  brain]
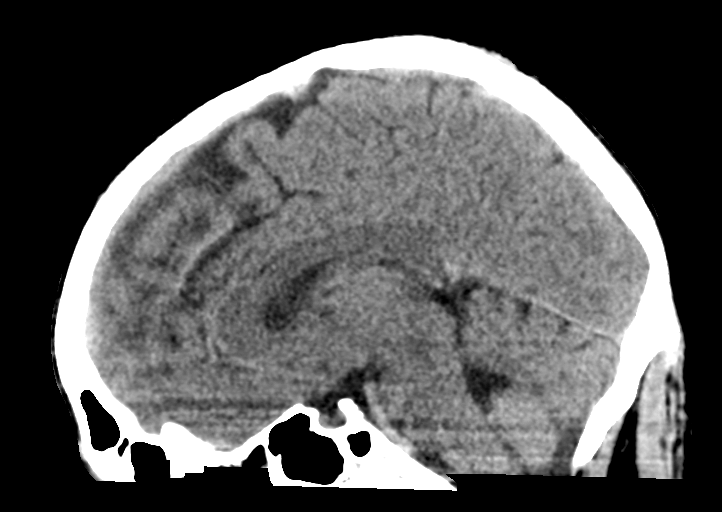
[im 35/52  brain]
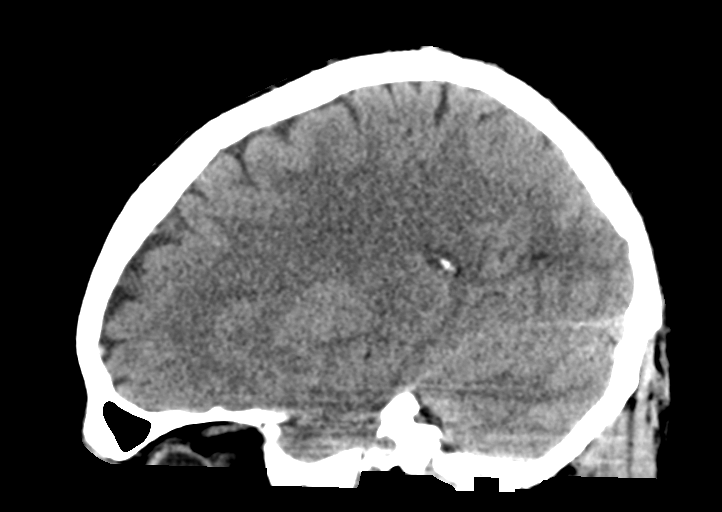

[16 of 47 positions shown; findings below may reference images not displayed]

FINDINGS: Brain: There is no mass, hemorrhage or extra-axial collection. The
size and configuration of the ventricles and extra-axial CSF spaces
are normal. There is no acute or chronic infarction. The brain
parenchyma is normal.

Vascular: No abnormal hyperdensity of the major intracranial
arteries or dural venous sinuses. No intracranial atherosclerosis.

Skull: The visualized skull base, calvarium and extracranial soft
tissues are normal.

Sinuses/Orbits: No fluid levels or advanced mucosal thickening of
the visualized paranasal sinuses. No mastoid or middle ear effusion.
The orbits are normal.
IMPRESSION: Normal head CT.

## 2020-03-24 MED ORDER — PREDNISONE 10 MG PO TABS: ORAL_TABLET | ORAL | 0 refills | Status: DC

## 2020-03-24 MED ORDER — TIZANIDINE HCL 4 MG PO TABS: 4.0000 mg | ORAL_TABLET | Freq: Three times a day (TID) | ORAL | 0 refills | Status: DC | PRN
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
## 2020-03-24 NOTE — ED Triage Notes (Signed)
Patient has taken OTC Tylenol for the neck pain and knee pain.

## 2020-03-24 NOTE — Discharge Instructions (Signed)
Rest, ice, elevation.  Medication as prescribed.  Take care  Dr. Terilyn Sano  
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
## 2020-04-13 ENCOUNTER — Ambulatory Visit: Payer: 59 | Admitting: Unknown Physician Specialty

## 2020-04-16 ENCOUNTER — Ambulatory Visit (INDEPENDENT_AMBULATORY_CARE_PROVIDER_SITE_OTHER): Payer: 59

## 2020-04-16 ENCOUNTER — Ambulatory Visit
Admission: EM | Admit: 2020-04-16 | Discharge: 2020-04-16 | Disposition: A | Discharge status: Home / Self Care | Payer: 59 | Attending: Internal Medicine | Admitting: Internal Medicine

## 2020-04-16 ENCOUNTER — Encounter: Payer: Self-pay | Admitting: Emergency Medicine

## 2020-04-16 ENCOUNTER — Other Ambulatory Visit: Payer: Self-pay

## 2020-04-16 DIAGNOSIS — M545 Low back pain, unspecified: Secondary | ICD-10-CM | POA: Diagnosis not present

## 2020-04-16 LAB — URINALYSIS, COMPLETE (UACMP) WITH MICROSCOPIC
Bacteria, UA: NONE SEEN
Bilirubin Urine: NEGATIVE
Glucose, UA: NEGATIVE mg/dL
Hgb urine dipstick: NEGATIVE
Ketones, ur: NEGATIVE mg/dL
Leukocytes,Ua: NEGATIVE
Nitrite: NEGATIVE
Protein, ur: NEGATIVE mg/dL
Specific Gravity, Urine: 1.02 (ref 1.005–1.030)
Squamous Epithelial / HPF: NONE SEEN (ref 0–5)
WBC, UA: NONE SEEN WBC/hpf (ref 0–5)
pH: 5 (ref 5.0–8.0)

## 2020-04-16 MED ORDER — TIZANIDINE HCL 4 MG PO TABS: 4.0000 mg | ORAL_TABLET | Freq: Every day | ORAL | 0 refills | Status: DC

## 2020-04-16 MED ORDER — NABUMETONE 750 MG PO TABS: 750.0000 mg | ORAL_TABLET | Freq: Two times a day (BID) | ORAL | 0 refills | Status: DC

## 2020-04-16 MED ORDER — METAXALONE 800 MG PO TABS: 800.0000 mg | ORAL_TABLET | Freq: Two times a day (BID) | ORAL | 0 refills | Status: DC
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
## 2020-04-16 NOTE — Discharge Instructions (Addendum)
Your back xray is normal with no fractures Your urine test is normal Go back to physical therapy and or chiropractic care to help with your healing
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
## 2020-04-17 ENCOUNTER — Ambulatory Visit: Payer: 59 | Admitting: Unknown Physician Specialty

## 2020-07-14 ENCOUNTER — Telehealth: Payer: Self-pay

## 2020-07-14 NOTE — Telephone Encounter (Signed)
Called pt to schedule f.u he stated he would like a physical and that he would call them to make sure what is covered with his ins.

## 2020-07-23 ENCOUNTER — Ambulatory Visit
Admission: RE | Admit: 2020-07-23 | Discharge: 2020-07-23 | Disposition: A | Discharge status: Home / Self Care | Payer: 59 | Source: Ambulatory Visit | Attending: Nurse Practitioner | Admitting: Nurse Practitioner

## 2020-07-23 ENCOUNTER — Ambulatory Visit (INDEPENDENT_AMBULATORY_CARE_PROVIDER_SITE_OTHER): Payer: 59 | Admitting: Nurse Practitioner

## 2020-07-23 ENCOUNTER — Encounter: Payer: Self-pay | Admitting: Nurse Practitioner

## 2020-07-23 ENCOUNTER — Ambulatory Visit
Admission: RE | Admit: 2020-07-23 | Discharge: 2020-07-23 | Disposition: A | Discharge status: Home / Self Care | Payer: 59 | Attending: Nurse Practitioner | Admitting: Nurse Practitioner

## 2020-07-23 ENCOUNTER — Other Ambulatory Visit: Payer: Self-pay

## 2020-07-23 VITALS — BP 127/74 | HR 62 | Temp 98.4°F | Wt 201.0 lb

## 2020-07-23 DIAGNOSIS — M25562 Pain in left knee: Secondary | ICD-10-CM | POA: Insufficient documentation

## 2020-07-23 NOTE — Progress Notes (Signed)
Established Patient Office Visit  Subjective:  Patient ID: Joel Mills, male    DOB: [birth year]  Age: 40 y.o. MRN: [id number]  CC:  Chief Complaint  Patient presents with  . Knee Pain    Pt states his left knee has been hurting for about a week and a half. No known injuries per patient, rating pain at an 8. States he has noticed some swelling as well in the last few days.     HPI Joel Mills presents for left knee pain  KNEE PAIN   Duration: weeks, 2 weeks Involved knee: left Mechanism of injury: unknown Location:posterior Onset: gradual Severity: 8/10  Quality:  pressure-like, feels like a tight rubberband Frequency: better in the morning, worse as the day goes on Radiation: no Aggravating factors: weight bearing, walking, bending and movement  Alleviating factors: nothing and rest  Status: fluctuating Treatments attempted: rest and ice  Relief with NSAIDs?:  No NSAIDs Taken. History of bleeding ulcer with ibuprofen Weakness with weight bearing or walking: yes Sensation of giving way: yes Locking: yes Popping: yes Bruising: no Swelling: yes, intermittent Redness: yes Paresthesias/decreased sensation: yes Fevers: no  Past Medical History:  Diagnosis Date  . Anemia    40 years old  . Asthma   . Depression     Past Surgical History:  Procedure Laterality Date  . ADENOIDECTOMY    . ESOPHAGOGASTRODUODENOSCOPY (EGD) WITH PROPOFOL N/A 01/07/2018   Procedure: ESOPHAGOGASTRODUODENOSCOPY (EGD) WITH PROPOFOL;  Surgeon: Toledo, Boykin Nearing, MD;  Location: [facility];  Service: Gastroenterology;  Laterality: N/A;  . VASECTOMY      Family History  Problem Relation Age of Onset  . Diabetes Mother   . Hypertension Father   . Depression Father   . Cancer Sister   . Depression Sister     Social History   Socioeconomic History  . Marital status: Married    Spouse name: Not on file  . Number of children: 2  . Years of education: Not on file  . Highest  education level: Not on file  Occupational History  . Occupation: Corporate investment banker  Tobacco Use  . Smoking status: Former Smoker    Types: Cigarettes    Quit date: 11/30/2018    Years since quitting: 1.6  . Smokeless tobacco: Never Used  Vaping Use  . Vaping Use: Never used  Substance and Sexual Activity  . Alcohol use: Yes    Comment: socially  . Drug use: Yes    Frequency: 3.0 times per week    Types: Marijuana  . Sexual activity: Yes  Other Topics Concern  . Not on file  Social History Narrative  . Not on file   Social Determinants of Health   Financial Resource Strain: Not on file  Food Insecurity: Not on file  Transportation Needs: Not on file  Physical Activity: Not on file  Stress: Not on file  Social Connections: Not on file  Intimate Partner Violence: Not on file    Outpatient Medications Prior to Visit  Medication Sig Dispense Refill  . metaxalone (SKELAXIN) 800 MG tablet Take 1 tablet (800 mg total) by mouth in the morning and at bedtime. During the day if it does not cause sedation 21 tablet 0  . nabumetone (RELAFEN) 750 MG tablet Take 1 tablet (750 mg total) by mouth 2 (two) times daily. 30 tablet 0  . tiZANidine (ZANAFLEX) 4 MG tablet Take 1 tablet (4 mg total) by mouth at bedtime. 30 tablet 0   No  facility-administered medications prior to visit.    Allergies  Allergen Reactions  . Flexeril [Cyclobenzaprine] Other (See Comments)    Mood swings per patient    ROS Review of Systems  Constitutional: Positive for fatigue.  HENT: Negative.   Eyes: Negative.   Respiratory: Negative.   Cardiovascular: Negative.   Endocrine: Negative.   Musculoskeletal: Positive for arthralgias (knee pain) and back pain.  Skin: Negative.   Neurological: Negative.   Psychiatric/Behavioral: Negative.       Objective:    Physical Exam Vitals and nursing note reviewed.  Constitutional:      General: He is not in acute distress.    Appearance: Normal  appearance.  HENT:     Head: Normocephalic.  Eyes:     Conjunctiva/sclera: Conjunctivae normal.  Cardiovascular:     Rate and Rhythm: Normal rate and regular rhythm.     Pulses: Normal pulses.     Heart sounds: Normal heart sounds.  Pulmonary:     Effort: Pulmonary effort is normal.     Breath sounds: Normal breath sounds.  Musculoskeletal:        General: Tenderness present.     Cervical back: Normal range of motion.     Comments: Full ROM to bilateral knees, however pain with left knee ROM. Edema noted to the back of his left knee with tenderness.   Skin:    General: Skin is warm and dry.  Neurological:     General: No focal deficit present.     Mental Status: He is alert and oriented to person, place, and time.  Psychiatric:        Mood and Affect: Mood normal.        Behavior: Behavior normal.        Thought Content: Thought content normal.        Judgment: Judgment normal.     BP 127/74   Pulse 62   Temp 98.4 F (36.9 C) (Oral)   Wt 201 lb (91.2 kg)   SpO2 99%   BMI 25.81 kg/m  Wt Readings from Last 3 Encounters:  07/23/20 201 lb (91.2 kg)  04/16/20 195 lb 1.7 oz (88.5 kg)  03/24/20 195 lb (88.5 kg)    Lab Results  Component Value Date   TSH 1.560 10/24/2019   Lab Results  Component Value Date   WBC 5.9 10/24/2019   HGB 15.8 10/24/2019   HCT 47.3 10/24/2019   MCV 95 10/24/2019   PLT 172 10/24/2019   Lab Results  Component Value Date   NA 139 10/24/2019   K 4.2 10/24/2019   CO2 24 10/24/2019   GLUCOSE 61 (L) 10/24/2019   BUN 14 10/24/2019   CREATININE 1.06 10/24/2019   BILITOT 0.5 10/24/2019   ALKPHOS 75 10/24/2019   AST 19 10/24/2019   ALT 19 10/24/2019   PROT 7.3 10/24/2019   ALBUMIN 4.8 10/24/2019   CALCIUM 9.3 10/24/2019   ANIONGAP 5 09/23/2018   Lab Results  Component Value Date   CHOL 146 10/24/2019   Lab Results  Component Value Date   HDL 31 (L) 10/24/2019   Lab Results  Component Value Date   LDLCALC 90 10/24/2019   Lab  Results  Component Value Date   TRIG 139 10/24/2019      Assessment & Plan:   Problem List Items Addressed This Visit      Other   Acute pain of left knee - Primary    Pain and intermittent swelling to posterior knee  with pain that radiates to the front of his knee. No known injury, however does a lot of climbing ladders, kneeling, and frequent activity at work. Will check an ultrasound to rule out baker's cyst and x-ray of knee. He can use voltaren gel up to 4 times a day along with tylenol to help with pain. Also continue to use heat/ice. Will follow-up in 4 weeks or sooner if symptoms worsen.       Relevant Orders   DG Knee Complete 4 Views Left   [place] LT LOWER EXTREM LTD SOFT TISSUE NON VASCULAR      No orders of the defined types were placed in this encounter.   Follow-up: Return in about 4 weeks (around 08/20/2020) for knee pain.    Gerre Scull, NP

## 2020-07-23 NOTE — Assessment & Plan Note (Addendum)
Pain and intermittent swelling to posterior knee with pain that radiates to the front of his knee. No known injury, however does a lot of climbing ladders, kneeling, and frequent activity at work. Will check an ultrasound to rule out baker's cyst and x-ray of knee. He can use voltaren gel up to 4 times a day along with tylenol to help with pain. Also continue to use heat/ice. Will follow-up in 4 weeks or sooner if symptoms worsen.

## 2020-07-23 NOTE — Patient Instructions (Addendum)
It was great to see you!  You can try voltaren gel over the counter up to 4 times a day. I ordered x-ray of your knee and ultrasound. The x-ray you can walk in Monday-Thursday. You will get a call for the ultrasound.   Let's follow-up in 3-4 weeks, sooner if you have concerns.  Take care,  Rodman Pickle, NP

## 2020-07-24 ENCOUNTER — Encounter: Payer: Self-pay | Admitting: Nurse Practitioner

## 2020-07-24 ENCOUNTER — Ambulatory Visit (INDEPENDENT_AMBULATORY_CARE_PROVIDER_SITE_OTHER): Payer: 59 | Admitting: Nurse Practitioner

## 2020-07-24 VITALS — BP 118/81 | HR 66 | Temp 97.6°F | Wt 201.0 lb

## 2020-07-24 DIAGNOSIS — M5440 Lumbago with sciatica, unspecified side: Secondary | ICD-10-CM

## 2020-07-24 DIAGNOSIS — G8929 Other chronic pain: Secondary | ICD-10-CM

## 2020-07-24 NOTE — Patient Instructions (Signed)
It was great to see you!  I will refer you to physical therapy. You can also schedule an appointment with a chiropractor. You can use ice/heat and voltaren gel on your back.   Let's follow-up in 2 months, sooner if you have concerns.  If a referral was placed today, you will be contacted for an appointment. Please note that routine referrals can sometimes take up to 3-4 weeks to process. Please call our office if you haven't heard anything after this time frame.  Take care,  Rodman Pickle, NP
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
## 2020-08-03 ENCOUNTER — Encounter: Payer: Self-pay | Admitting: Nurse Practitioner

## 2020-08-03 ENCOUNTER — Other Ambulatory Visit: Payer: Self-pay

## 2020-08-03 ENCOUNTER — Ambulatory Visit (INDEPENDENT_AMBULATORY_CARE_PROVIDER_SITE_OTHER): Payer: 59 | Admitting: Nurse Practitioner

## 2020-08-03 VITALS — BP 127/90 | HR 82 | Temp 98.3°F | Ht 74.0 in | Wt 203.0 lb

## 2020-08-03 DIAGNOSIS — M5441 Lumbago with sciatica, right side: Secondary | ICD-10-CM

## 2020-08-03 DIAGNOSIS — G8929 Other chronic pain: Secondary | ICD-10-CM | POA: Diagnosis not present

## 2020-08-03 DIAGNOSIS — M5442 Lumbago with sciatica, left side: Secondary | ICD-10-CM

## 2020-08-03 MED ORDER — TRIAMCINOLONE ACETONIDE 40 MG/ML IJ SUSP
40.0000 mg | Freq: Once | INTRAMUSCULAR | Status: AC
  Administered 2020-08-03: 40 mg via INTRAMUSCULAR

## 2020-08-03 MED ORDER — GABAPENTIN 100 MG PO CAPS: 100.0000 mg | ORAL_CAPSULE | Freq: Every day | ORAL | 0 refills | Status: DC

## 2020-08-03 NOTE — Patient Instructions (Signed)

## 2020-08-03 NOTE — Assessment & Plan Note (Signed)
Pain has increased since last visit. Noticed numbness and tingling in bilateral extremities as well. No red flags. He has been hesitant on taking medication for pain. Will give kenolog injection IM today and trial gabapentin 100mg  at bedtime. Has appointment scheduled for PT on Thursday. Follow up in 4 weeks or sooner if symptoms worsen.
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
## 2020-08-13 ENCOUNTER — Telehealth: Payer: Self-pay

## 2020-08-13 ENCOUNTER — Emergency Department
Admission: EM | Admit: 2020-08-13 | Discharge: 2020-08-13 | Disposition: A | Discharge status: Home / Self Care | Payer: 59 | Attending: Emergency Medicine | Admitting: Emergency Medicine

## 2020-08-13 ENCOUNTER — Encounter: Payer: Self-pay | Admitting: Nurse Practitioner

## 2020-08-13 ENCOUNTER — Ambulatory Visit (INDEPENDENT_AMBULATORY_CARE_PROVIDER_SITE_OTHER): Payer: 59 | Admitting: Nurse Practitioner

## 2020-08-13 ENCOUNTER — Other Ambulatory Visit: Payer: Self-pay

## 2020-08-13 VITALS — BP 127/86 | HR 71 | Temp 98.4°F | Ht 73.03 in | Wt 200.2 lb

## 2020-08-13 DIAGNOSIS — M5441 Lumbago with sciatica, right side: Secondary | ICD-10-CM

## 2020-08-13 DIAGNOSIS — M5442 Lumbago with sciatica, left side: Secondary | ICD-10-CM

## 2020-08-13 DIAGNOSIS — M545 Low back pain, unspecified: Secondary | ICD-10-CM | POA: Diagnosis present

## 2020-08-13 DIAGNOSIS — G8929 Other chronic pain: Secondary | ICD-10-CM

## 2020-08-13 DIAGNOSIS — J45909 Unspecified asthma, uncomplicated: Secondary | ICD-10-CM | POA: Insufficient documentation

## 2020-08-13 DIAGNOSIS — Z87891 Personal history of nicotine dependence: Secondary | ICD-10-CM | POA: Diagnosis not present

## 2020-08-13 MED ORDER — PREDNISONE 10 MG (21) PO TBPK: ORAL_TABLET | ORAL | 0 refills | Status: DC

## 2020-08-13 MED ORDER — METHOCARBAMOL 500 MG PO TABS: 500.0000 mg | ORAL_TABLET | Freq: Three times a day (TID) | ORAL | 0 refills | Status: DC | PRN

## 2020-08-13 MED ORDER — PREDNISONE 10 MG (21) PO TBPK: ORAL_TABLET | Freq: Every day | ORAL | 0 refills | Status: AC

## 2020-08-13 MED ORDER — PREDNISONE 10 MG (21) PO TBPK: ORAL_TABLET | Freq: Every day | ORAL | 0 refills | Status: DC

## 2020-08-13 MED ORDER — METHOCARBAMOL 500 MG PO TABS: 500.0000 mg | ORAL_TABLET | Freq: Three times a day (TID) | ORAL | 0 refills | Status: AC | PRN

## 2020-08-13 MED ORDER — PREDNISONE 20 MG PO TABS
60.0000 mg | ORAL_TABLET | Freq: Once | ORAL | Status: AC
  Administered 2020-08-13: 60 mg via ORAL
  Filled 2020-08-13: qty 3
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
## 2020-08-13 NOTE — Progress Notes (Signed)
Acute Office Visit  Subjective:    Patient ID: Joel Mills, male    DOB: [birth year], 40 y.o.   MRN: [id number]  Chief Complaint  Patient presents with  . Back Pain    Left leg feels like it is sleeping all the time, started on Saturday. Patient states that his bowel and urinary habits have changes. Patient states that when the urge comes he has to go, he can not hold.     HPI Patient is in today for low back pain with numbness and tingling down his legs, left worse than right. He has been seen several times over the last month for this problem. He went to physical therapy and saw a Land. Over the weekend, the numbness and tingling got worse in the left leg. He said that physical therapy won't see him again until he follows up with his PCP for worsening symptoms.   He had his wife touch his leg and noticed that he couldn't feel what she was doing to his leg (pinching it). The tingling fluctuates. Tried gabapentin and didn't notice any improvement. Tried some muscle relaxants which helped him relax and fall asleep, but then he wakes up an hour later with pain in his back. Kenalog shot helped some in the office last week. Notices that the more he is up and moving around, the more it hurts. Endorses nausea. Has also noticed urgency with bowel and bladder. Denies incontinence. Denies fevers.   Past Medical History:  Diagnosis Date  . Anemia    40 years old  . Asthma   . Depression     Past Surgical History:  Procedure Laterality Date  . ADENOIDECTOMY    . ESOPHAGOGASTRODUODENOSCOPY (EGD) WITH PROPOFOL N/A 01/07/2018   Procedure: ESOPHAGOGASTRODUODENOSCOPY (EGD) WITH PROPOFOL;  Surgeon: Toledo, Boykin Nearing, MD;  Location: [facility];  Service: Gastroenterology;  Laterality: N/A;  . VASECTOMY      Family History  Problem Relation Age of Onset  . Diabetes Mother   . Hypertension Father   . Depression Father   . Cancer Sister   . Depression Sister     Social History    Socioeconomic History  . Marital status: Married    Spouse name: Not on file  . Number of children: 2  . Years of education: Not on file  . Highest education level: Not on file  Occupational History  . Occupation: Corporate investment banker  Tobacco Use  . Smoking status: Former Smoker    Types: Cigarettes    Quit date: 11/30/2018    Years since quitting: 1.7  . Smokeless tobacco: Never Used  Vaping Use  . Vaping Use: Never used  Substance and Sexual Activity  . Alcohol use: Yes    Comment: socially  . Drug use: Yes    Frequency: 3.0 times per week    Types: Marijuana  . Sexual activity: Yes  Other Topics Concern  . Not on file  Social History Narrative  . Not on file   Social Determinants of Health   Financial Resource Strain: Not on file  Food Insecurity: Not on file  Transportation Needs: Not on file  Physical Activity: Not on file  Stress: Not on file  Social Connections: Not on file  Intimate Partner Violence: Not on file    Outpatient Medications Prior to Visit  Medication Sig Dispense Refill  . gabapentin (NEURONTIN) 100 MG capsule Take 1 capsule (100 mg total) by mouth at bedtime. (Patient not taking: Reported on 08/13/2020) 30  capsule 0   No facility-administered medications prior to visit.    Allergies  Allergen Reactions  . Flexeril [Cyclobenzaprine] Other (See Comments)    Mood swings per patient    Review of Systems  Constitutional: Positive for fatigue. Negative for fever.  Respiratory: Positive for chest tightness. Negative for cough and shortness of breath.   Cardiovascular: Negative.   Gastrointestinal: Positive for nausea. Negative for constipation, diarrhea and vomiting.       Bowel and bladder urgency  Musculoskeletal: Positive for back pain.  Neurological: Positive for numbness (and tingling in left leg).       Objective:    Physical Exam Vitals and nursing note reviewed.  Constitutional:      Comments: Sitting uncomfortably in  chair, shifting frequently   HENT:     Head: Normocephalic.  Eyes:     Conjunctiva/sclera: Conjunctivae normal.  Cardiovascular:     Rate and Rhythm: Normal rate and regular rhythm.     Pulses: Normal pulses.     Heart sounds: Normal heart sounds.  Pulmonary:     Effort: Pulmonary effort is normal.     Breath sounds: Normal breath sounds.  Musculoskeletal:     Cervical back: Normal range of motion.     Comments: Limited lumbar ROM due to pain.   Skin:    General: Skin is warm.  Neurological:     Mental Status: He is alert and oriented to person, place, and time.  Psychiatric:        Mood and Affect: Mood normal.        Behavior: Behavior normal.        Thought Content: Thought content normal.        Judgment: Judgment normal.     BP 127/86   Pulse 71   Temp 98.4 F (36.9 C) (Oral)   Ht 6' 1.03" (1.855 m)   Wt 200 lb 3.2 oz (90.8 kg)   SpO2 99%   BMI 26.39 kg/m  Wt Readings from Last 3 Encounters:  08/13/20 200 lb 3.2 oz (90.8 kg)  08/03/20 203 lb (92.1 kg)  07/24/20 201 lb (91.2 kg)    Lab Results  Component Value Date   TSH 1.560 10/24/2019   Lab Results  Component Value Date   WBC 5.9 10/24/2019   HGB 15.8 10/24/2019   HCT 47.3 10/24/2019   MCV 95 10/24/2019   PLT 172 10/24/2019   Lab Results  Component Value Date   NA 139 10/24/2019   K 4.2 10/24/2019   CO2 24 10/24/2019   GLUCOSE 61 (L) 10/24/2019   BUN 14 10/24/2019   CREATININE 1.06 10/24/2019   BILITOT 0.5 10/24/2019   ALKPHOS 75 10/24/2019   AST 19 10/24/2019   ALT 19 10/24/2019   PROT 7.3 10/24/2019   ALBUMIN 4.8 10/24/2019   CALCIUM 9.3 10/24/2019   ANIONGAP 5 09/23/2018   Lab Results  Component Value Date   CHOL 146 10/24/2019   Lab Results  Component Value Date   HDL 31 (L) 10/24/2019   Lab Results  Component Value Date   LDLCALC 90 10/24/2019   Lab Results  Component Value Date   TRIG 139 10/24/2019       Assessment & Plan:   Problem List Items Addressed This  Visit      Nervous and Auditory   Chronic bilateral low back pain with bilateral sciatica - Primary    Numbness and tingling sensation in left leg worsened in the past 5 days.  Also noticing bowel and bladder urgency. Recommend he goes to the emergency room for further imaging today. Patient verbalizes and agrees to plan. Follow-up after recommendations/imagaing from hospital.           No orders of the defined types were placed in this encounter.    Gerre Scull, NP

## 2020-08-13 NOTE — Addendum Note (Signed)
Addended by: Rodman Pickle A on: 08/13/2020 11:11 AM   Modules accepted: Orders

## 2020-08-13 NOTE — Telephone Encounter (Signed)
Copied from CRM [id number]. Topic: General - Inquiry >> Aug 13, 2020 10:33 AM Elliot Gault wrote: Reason for CRM: Patient was seen today and states he changed his mind and would like PCP to submit an urgent request to [facility]  ) for numbness in left leg

## 2020-08-13 NOTE — Assessment & Plan Note (Signed)
Numbness and tingling sensation in left leg worsened in the past 5 days. Also noticing bowel and bladder urgency. Recommend he goes to the emergency room for further imaging today. Patient verbalizes and agrees to plan. Follow-up after recommendations/imagaing from hospital.

## 2020-08-13 NOTE — ED Notes (Signed)
Pt provided w/c
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
## 2020-08-13 NOTE — Discharge Instructions (Addendum)
Take tapered prednisone and robaxin as directed.

## 2020-08-13 NOTE — Telephone Encounter (Signed)
Patient was informed that referral was place and that it is highly recommended that he go to the ER. Patient verbalized understanding
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
## 2020-08-17 ENCOUNTER — Ambulatory Visit
Admission: RE | Admit: 2020-08-17 | Discharge: 2020-08-17 | Disposition: A | Discharge status: Home / Self Care | Payer: 59 | Source: Ambulatory Visit | Attending: Nurse Practitioner | Admitting: Nurse Practitioner

## 2020-08-17 ENCOUNTER — Other Ambulatory Visit: Payer: Self-pay | Admitting: Nurse Practitioner

## 2020-08-17 ENCOUNTER — Other Ambulatory Visit: Payer: Self-pay

## 2020-08-17 DIAGNOSIS — M5442 Lumbago with sciatica, left side: Secondary | ICD-10-CM

## 2020-08-17 DIAGNOSIS — M25562 Pain in left knee: Secondary | ICD-10-CM | POA: Insufficient documentation

## 2020-08-17 DIAGNOSIS — G8929 Other chronic pain: Secondary | ICD-10-CM

## 2020-08-20 ENCOUNTER — Encounter: Payer: Self-pay | Admitting: Nurse Practitioner

## 2020-08-20 ENCOUNTER — Other Ambulatory Visit: Payer: Self-pay

## 2020-08-20 ENCOUNTER — Ambulatory Visit: Payer: 59 | Admitting: Nurse Practitioner

## 2020-08-20 ENCOUNTER — Ambulatory Visit (INDEPENDENT_AMBULATORY_CARE_PROVIDER_SITE_OTHER): Payer: 59 | Admitting: Nurse Practitioner

## 2020-08-20 VITALS — BP 120/71 | HR 77 | Temp 98.6°F

## 2020-08-20 DIAGNOSIS — G8929 Other chronic pain: Secondary | ICD-10-CM | POA: Diagnosis not present

## 2020-08-20 DIAGNOSIS — M5441 Lumbago with sciatica, right side: Secondary | ICD-10-CM

## 2020-08-20 DIAGNOSIS — M5442 Lumbago with sciatica, left side: Secondary | ICD-10-CM | POA: Diagnosis not present

## 2020-08-20 NOTE — Patient Instructions (Addendum)
Baker Cyst  A Baker cyst, also called a popliteal cyst, is a growth that forms at the back of the knee. The cyst forms when the fluid-filled sac (bursa) that cushions the knee joint becomes enlarged. What are the causes? In most cases, a Baker cyst results from another knee problem that causes swelling inside the knee. This makes the fluid inside the knee joint (synovial fluid) flow into the bursa behind the knee, causing the bursa to enlarge. What increases the risk? You may be more likely to develop a Baker cyst if you already have a knee problem, such as:  A tear in cartilage that cushions the knee joint (meniscal tear).  A tear in the tissues that connect the bones of the knee joint (ligament tear).  Knee swelling from osteoarthritis, rheumatoid arthritis, or gout. What are the signs or symptoms? The main symptom of this condition is a lump behind the knee. This may be the only symptom of the condition. The lump may be painful, especially when the knee is straightened. If the lump is painful, the pain may come and go. The knee may also be stiff. Symptoms may quickly get more severe if the cyst breaks open (ruptures). If the cyst ruptures, you may feel the following in your knee and calf:  Sudden or worsening pain.  Swelling.  Bruising.  Redness in the calf. A Baker cyst does not always cause symptoms. How is this diagnosed? This condition may be diagnosed based on your symptoms and medical history. Your health care provider will also do a physical exam. This may include:  Feeling the cyst to check whether it is tender.  Checking your knee for signs of another knee condition that causes swelling. You may have imaging tests, such as:  X-rays.  MRI.  Ultrasound. How is this treated? A Baker cyst that is not painful may go away without treatment. If the cyst gets large or painful, it will likely get better if the underlying knee problem is treated. If needed, treatment for a  Baker cyst may include:  Resting.  Keeping weight off of the knee. This means not leaning on the knee to support your body weight.  Taking NSAIDs, such as ibuprofen, to reduce pain and swelling.  Having a procedure to drain the fluid from the cyst with a needle (aspiration). You may also get an injection of a medicine that reduces swelling (steroid).  Having surgery. This may be needed if other treatments do not work. This usually involves correcting knee damage and removing the cyst. Follow these instructions at home: Activity  Rest as told by your health care provider.  Avoid activities that make pain or swelling worse.  Return to your normal activities as told by your health care provider. Ask your health care provider what activities are safe for you.  Do not use the injured limb to support your body weight until your health care provider says that you can. Use crutches as told by your health care provider. General instructions  Take over-the-counter and prescription medicines only as told by your health care provider.  Keep all follow-up visits as told by your health care provider. This is important.   Contact a health care provider if:  You have knee pain, stiffness, or swelling that does not get better. Get help right away if:  You have sudden or worsening pain and swelling in your calf area. Summary  A Baker cyst, also called a popliteal cyst, is a growth that forms at   the back of the knee.  In most cases, a Baker cyst results from another knee problem that causes swelling inside the knee.  A Baker cyst that is not painful may go away without treatment.  If needed, treatment for a Baker cyst may include resting, keeping weight off of the knee, medicines, or draining fluid from the cyst.  Surgery may be needed if other treatments are not effective. This information is not intended to replace advice given to you by your health care provider. Make sure you discuss any  questions you have with your health care provider. Document Revised: 08/31/2018 Document Reviewed: 08/31/2018 Elsevier Patient Education  2021 [employer]  Amitriptyline tablets What is this medicine? AMITRIPTYLINE (a mee TRIP ti leen) is used to treat depression. This medicine may be used for other purposes; ask your health care provider or pharmacist if you have questions. COMMON BRAND NAME(S): Elavil, Vanatrip What should I tell my health care provider before I take this medicine? They need to know if you have any of these conditions:  an alcohol problem  asthma, difficulty breathing  bipolar disorder or schizophrenia  difficulty passing urine, prostate trouble  glaucoma  heart disease or previous heart attack  liver disease  over active thyroid  seizures  thoughts or plans of suicide, a previous suicide attempt, or family history of suicide attempt  an unusual or allergic reaction to amitriptyline, other medicines, foods, dyes, or preservatives  pregnant or trying to get pregnant  breast-feeding How should I use this medicine? Take this medicine by mouth with a drink of water. Follow the directions on the prescription label. You can take the tablets with or without food. Take your medicine at regular intervals. Do not take it more often than directed. Do not stop taking this medicine suddenly except upon the advice of your doctor. Stopping this medicine too quickly may cause serious side effects or your condition may worsen. A special MedGuide will be given to you by the pharmacist with each prescription and refill. Be sure to read this information carefully each time. Talk to your pediatrician regarding the use of this medicine in children. Special care may be needed. Overdosage: If you think you have taken too much of this medicine contact a poison control center or emergency room at once. NOTE: This medicine is only for you. Do not share this medicine with  others. What if I miss a dose? If you miss a dose, take it as soon as you can. If it is almost time for your next dose, take only that dose. Do not take double or extra doses. What may interact with this medicine? Do not take this medicine with any of the following medications:  arsenic trioxide  certain medicines used to regulate abnormal heartbeat or to treat other heart conditions  cisapride  droperidol  halofantrine  linezolid  MAOIs like Carbex, Eldepryl, Marplan, Nardil, and Parnate  methylene blue  other medicines for mental depression  phenothiazines like perphenazine, thioridazine and chlorpromazine  pimozide  probucol  procarbazine  sparfloxacin  St. John's Wort This medicine may also interact with the following medications:  atropine and related drugs like hyoscyamine, scopolamine, tolterodine and others  barbiturate medicines for inducing sleep or treating seizures, like phenobarbital  cimetidine  disulfiram  ethchlorvynol  thyroid hormones such as levothyroxine  ziprasidone This list may not describe all possible interactions. Give your health care provider a list of all the medicines, herbs, non-prescription drugs, or dietary supplements you use. Also  tell them if you smoke, drink alcohol, or use illegal drugs. Some items may interact with your medicine. What should I watch for while using this medicine? Tell your doctor if your symptoms do not get better or if they get worse. Visit your doctor or health care professional for regular checks on your progress. Because it may take several weeks to see the full effects of this medicine, it is important to continue your treatment as prescribed by your doctor. Patients and their families should watch out for new or worsening thoughts of suicide or depression. Also watch out for sudden changes in feelings such as feeling anxious, agitated, panicky, irritable, hostile, aggressive, impulsive, severely  restless, overly excited and hyperactive, or not being able to sleep. If this happens, especially at the beginning of treatment or after a change in dose, call your health care professional. Bonita Quin may get drowsy or dizzy. Do not drive, use machinery, or do anything that needs mental alertness until you know how this medicine affects you. Do not stand or sit up quickly, especially if you are an older patient. This reduces the risk of dizzy or fainting spells. Alcohol may interfere with the effect of this medicine. Avoid alcoholic drinks. Do not treat yourself for coughs, colds, or allergies without asking your doctor or health care professional for advice. Some ingredients can increase possible side effects. Your mouth may get dry. Chewing sugarless gum or sucking hard candy, and drinking plenty of water will help. Contact your doctor if the problem does not go away or is severe. This medicine may cause dry eyes and blurred vision. If you wear contact lenses you may feel some discomfort. Lubricating drops may help. See your eye doctor if the problem does not go away or is severe. This medicine can cause constipation. Try to have a bowel movement at least every 2 to 3 days. If you do not have a bowel movement for 3 days, call your doctor or health care professional. This medicine can make you more sensitive to the sun. Keep out of the sun. If you cannot avoid being in the sun, wear protective clothing and use sunscreen. Do not use sun lamps or tanning beds/booths. What side effects may I notice from receiving this medicine? Side effects that you should report to your doctor or health care professional as soon as possible:  allergic reactions like skin rash, itching or hives, swelling of the face, lips, or tongue  anxious  breathing problems  changes in vision  confusion  elevated mood, decreased need for sleep, racing thoughts, impulsive behavior  eye pain  fast, irregular heartbeat  feeling  faint or lightheaded, falls  feeling agitated, angry, or irritable  fever with increased sweating  hallucination, loss of contact with reality  seizures  stiff muscles  suicidal thoughts or other mood changes  tingling, pain, or numbness in the feet or hands  trouble passing urine or change in the amount of urine  trouble sleeping  unusually weak or tired  vomiting  yellowing of the eyes or skin Side effects that usually do not require medical attention (report to your doctor or health care professional if they continue or are bothersome):  change in sex drive or performance  change in appetite or weight  constipation  dizziness  dry mouth  nausea  tired  tremors  upset stomach This list may not describe all possible side effects. Call your doctor for medical advice about side effects. You may report side effects to  FDA at 1-800-FDA-1088. Where should I keep my medicine? Keep out of the reach of children. Store at room temperature between 20 and 25 degrees C (68 and 77 degrees F). Throw away any unused medicine after the expiration date. NOTE: This sheet is a summary. It may not cover all possible information. If you have questions about this medicine, talk to your doctor, pharmacist, or health care provider.  2021 Elsevier/Gold Standard (2018-04-10 13:04:32)   Duloxetine Delayed-Release Capsules What is this medicine? DULOXETINE (doo LOX e teen) is used to treat depression, anxiety, and different types of chronic pain. This medicine may be used for other purposes; ask your health care provider or pharmacist if you have questions. COMMON BRAND NAME(S): Cymbalta, Murrell Converse, Irenka What should I tell my health care provider before I take this medicine? They need to know if you have any of these conditions:  bipolar disorder  glaucoma  high blood pressure  kidney disease  liver disease  seizures  suicidal thoughts, plans or attempt; a previous  suicide attempt by you or a family member  take medicines that treat or prevent blood clots  taken medicines called MAOIs like Carbex, Eldepryl, Marplan, Nardil, and Parnate within 14 days  trouble passing urine  an unusual reaction to duloxetine, other medicines, foods, dyes, or preservatives  pregnant or trying to get pregnant  breast-feeding How should I use this medicine? Take this medicine by mouth with a glass of water. Follow the directions on the prescription label. Do not crush, cut or chew some capsules of this medicine. Some capsules may be opened and sprinkled on applesauce. Check with your doctor or pharmacist if you are not sure. You can take this medicine with or without food. Take your medicine at regular intervals. Do not take your medicine more often than directed. Do not stop taking this medicine suddenly except upon the advice of your doctor. Stopping this medicine too quickly may cause serious side effects or your condition may worsen. A special MedGuide will be given to you by the pharmacist with each prescription and refill. Be sure to read this information carefully each time. Talk to your pediatrician regarding the use of this medicine in children. While this drug may be prescribed for children as young as 53 years of age for selected conditions, precautions do apply. Overdosage: If you think you have taken too much of this medicine contact a poison control center or emergency room at once. NOTE: This medicine is only for you. Do not share this medicine with others. What if I miss a dose? If you miss a dose, take it as soon as you can. If it is almost time for your next dose, take only that dose. Do not take double or extra doses. What may interact with this medicine? Do not take this medicine with any of the following medications:  desvenlafaxine  levomilnacipran  linezolid  MAOIs like Carbex, Eldepryl, Emsam, Marplan, Nardil, and Parnate  methylene blue  (injected into a vein)  milnacipran  safinamide  thioridazine  venlafaxine  viloxazine This medicine may also interact with the following medications:  alcohol  amphetamines  aspirin and aspirin-like medicines  certain antibiotics like ciprofloxacin and enoxacin  certain medicines for blood pressure, heart disease, irregular heart beat  certain medicines for depression, anxiety, or psychotic disturbances  certain medicines for migraine headache like almotriptan, eletriptan, frovatriptan, naratriptan, rizatriptan, sumatriptan, zolmitriptan  certain medicines that treat or prevent blood clots like warfarin, enoxaparin, and dalteparin  cimetidine  fentanyl  lithium  NSAIDS, medicines for pain and inflammation, like ibuprofen or naproxen  phentermine  procarbazine  rasagiline  sibutramine  St. John's wort  theophylline  tramadol  tryptophan This list may not describe all possible interactions. Give your health care provider a list of all the medicines, herbs, non-prescription drugs, or dietary supplements you use. Also tell them if you smoke, drink alcohol, or use illegal drugs. Some items may interact with your medicine. What should I watch for while using this medicine? Tell your doctor if your symptoms do not get better or if they get worse. Visit your doctor or healthcare provider for regular checks on your progress. Because it may take several weeks to see the full effects of this medicine, it is important to continue your treatment as prescribed by your doctor. This medicine may cause serious skin reactions. They can happen weeks to months after starting the medicine. Contact your healthcare provider right away if you notice fevers or flu-like symptoms with a rash. The rash may be red or purple and then turn into blisters or peeling of the skin. Or, you might notice a red rash with swelling of the face, lips, or lymph nodes in your neck or under your  arms. Patients and their families should watch out for new or worsening thoughts of suicide or depression. Also watch out for sudden changes in feelings such as feeling anxious, agitated, panicky, irritable, hostile, aggressive, impulsive, severely restless, overly excited and hyperactive, or not being able to sleep. If this happens, especially at the beginning of treatment or after a change in dose, call your healthcare provider. You may get drowsy or dizzy. Do not drive, use machinery, or do anything that needs mental alertness until you know how this medicine affects you. Do not stand or sit up quickly, especially if you are an older patient. This reduces the risk of dizzy or fainting spells. Alcohol may interfere with the effect of this medicine. Avoid alcoholic drinks. This medicine can cause an increase in blood pressure. This medicine can also cause a sudden drop in your blood pressure, which may make you feel faint and increase the chance of a fall. These effects are most common when you first start the medicine or when the dose is increased, or during use of other medicines that can cause a sudden drop in blood pressure. Check with your doctor for instructions on monitoring your blood pressure while taking this medicine. Your mouth may get dry. Chewing sugarless gum or sucking hard candy, and drinking plenty of water, may help. Contact your doctor if the problem does not go away or is severe. What side effects may I notice from receiving this medicine? Side effects that you should report to your doctor or health care professional as soon as possible:  allergic reactions like skin rash, itching or hives, swelling of the face, lips, or tongue  anxious  breathing problems  confusion  changes in vision  chest pain  confusion  elevated mood, decreased need for sleep, racing thoughts, impulsive behavior  eye pain  fast, irregular heartbeat  feeling faint or lightheaded,  falls  feeling agitated, angry, or irritable  hallucination, loss of contact with reality  high blood pressure  loss of balance or coordination  palpitations  redness, blistering, peeling or loosening of the skin, including inside the mouth  restlessness, pacing, inability to keep still  seizures  stiff muscles  suicidal thoughts or other mood changes  trouble passing urine or change in the  amount of urine  trouble sleeping  unusual bleeding or bruising  unusually weak or tired  vomiting  yellowing of the eyes or skin Side effects that usually do not require medical attention (report to your doctor or health care professional if they continue or are bothersome):  change in sex drive or performance  change in appetite or weight  constipation  dizziness  dry mouth  headache  increased sweating  nausea  tired This list may not describe all possible side effects. Call your doctor for medical advice about side effects. You may report side effects to FDA at 1-800-FDA-1088. Where should I keep my medicine? Keep out of the reach of children and pets. Store at room temperature between 15 and 30 degrees C (59 to 86 degrees F). Get rid of any unused medicine after the expiration date. To get rid of medicines that are no longer needed or have expired:  Take the medicine to a medicine take-back program. Check with your pharmacy or law enforcement to find a location.  If you cannot return the medicine, check the label or package insert to see if the medicine should be thrown out in the garbage or flushed down the toilet. If you are not sure, ask your health care provider. If it is safe to put it in the trash, take the medicine out of the container. Mix the medicine with cat litter, dirt, coffee grounds, or other unwanted substance. Seal the mixture in a bag or container. Put it in the trash. NOTE: This sheet is a summary. It may not cover all possible information. If  you have questions about this medicine, talk to your doctor, pharmacist, or health care provider.  2021 Elsevier/Gold Standard (2020-03-05 16:06:16)

## 2020-08-20 NOTE — Assessment & Plan Note (Signed)
Chronic, ongoing. Has done 2 weeks of PT and saw a chiropractor. Numbness in left leg is getting worse. 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 MRI was approved today after a peer to peer call. Discussed that he should be hearing soon to schedule the MRI and to set up an appointment with orthopedics. Continue taking the prednisone until course is completed. Discussed starting cymbalta or amitriptyline to help with pain. He will review information on the medications and let us know in the future if he would like a prescription. Follow-up in 4 weeks.

## 2020-08-20 NOTE — Progress Notes (Signed)
Acute Office Visit  Subjective:    Patient ID: Joel Mills, male    DOB: [birth year], 40 y.o.   MRN: [id number]  Chief Complaint  Patient presents with  . Back Pain    Patient states his still having leg pain and numbness in his leg. Patient states he was prescribed prednisone in the ER last Thursday and states he does not notice any change.     HPI Patient is in today for follow up on back pain and numbness in his left leg. He states that he started taking prednisone after the visit to the ER last week. The prednisone does not seem to be helping with his pain and he thinks the numbness is getting even worse.   Past Medical History:  Diagnosis Date  . Anemia    40 years old  . Asthma   . Depression     Past Surgical History:  Procedure Laterality Date  . ADENOIDECTOMY    . ESOPHAGOGASTRODUODENOSCOPY (EGD) WITH PROPOFOL N/A 01/07/2018   Procedure: ESOPHAGOGASTRODUODENOSCOPY (EGD) WITH PROPOFOL;  Surgeon: Toledo, Boykin Nearing, MD;  Location: [facility] ENDOSCOPY;  Service: Gastroenterology;  Laterality: N/A;  . VASECTOMY      Family History  Problem Relation Age of Onset  . Diabetes Mother   . Hypertension Father   . Depression Father   . Cancer Sister   . Depression Sister     Social History   Socioeconomic History  . Marital status: Married    Spouse name: Not on file  . Number of children: 2  . Years of education: Not on file  . Highest education level: Not on file  Occupational History  . Occupation: Corporate investment banker  Tobacco Use  . Smoking status: Former Smoker    Types: Cigarettes    Quit date: 11/30/2018    Years since quitting: 1.7  . Smokeless tobacco: Never Used  Vaping Use  . Vaping Use: Never used  Substance and Sexual Activity  . Alcohol use: Yes    Comment: socially  . Drug use: Not Currently    Frequency: 3.0 times per week    Types: Marijuana  . Sexual activity: Yes  Other Topics Concern  . Not on file  Social History Narrative  . Not on file    Social Determinants of Health   Financial Resource Strain: Not on file  Food Insecurity: Not on file  Transportation Needs: Not on file  Physical Activity: Not on file  Stress: Not on file  Social Connections: Not on file  Intimate Partner Violence: Not on file    Outpatient Medications Prior to Visit  Medication Sig Dispense Refill  . predniSONE (STERAPRED UNI-PAK 21 TAB) 10 MG (21) TBPK tablet Take by mouth daily. 6,6,5,5,4,4,3,3,2,2,1,1 42 tablet 0  . gabapentin (NEURONTIN) 100 MG capsule Take 1 capsule (100 mg total) by mouth at bedtime. (Patient not taking: No sig reported) 30 capsule 0  . predniSONE (STERAPRED UNI-PAK 21 TAB) 10 MG (21) TBPK tablet 6,6,5,5,4,4,3,3,2,2,1,1 (Patient not taking: Reported on 08/20/2020) 42 tablet 0   No facility-administered medications prior to visit.    Allergies  Allergen Reactions  . Flexeril [Cyclobenzaprine] Other (See Comments)    Mood swings per patient    Review of Systems  Constitutional: Positive for fatigue. Negative for fever.  HENT: Negative.   Eyes: Negative.   Respiratory: Negative.   Cardiovascular: Negative.   Gastrointestinal: Positive for constipation. Negative for abdominal pain and diarrhea.  Genitourinary: Negative.   Musculoskeletal: Positive for back  pain (with radiating numbness and weakness down left leg).  Skin: Negative.   Neurological: Positive for numbness (left leg).       Objective:    Physical Exam Vitals and nursing note reviewed.  Constitutional:      Appearance: Normal appearance.  HENT:     Head: Normocephalic.  Musculoskeletal:     Comments: Limited ROM of motion to left knee due to pain. 4/5 strength in left leg. Gait is unsteady. Positive straight leg raise on left leg.   Neurological:     Mental Status: He is alert and oriented to person, place, and time.  Psychiatric:        Mood and Affect: Mood normal.        Behavior: Behavior normal.        Thought Content: Thought content  normal.        Judgment: Judgment normal.     BP 120/71   Pulse 77   Temp 98.6 F (37 C) (Oral)   SpO2 97%  Wt Readings from Last 3 Encounters:  08/13/20 200 lb (90.7 kg)  08/13/20 200 lb 3.2 oz (90.8 kg)  08/03/20 203 lb (92.1 kg)    There are no preventive care reminders to display for this patient.  There are no preventive care reminders to display for this patient.   Lab Results  Component Value Date   TSH 1.560 10/24/2019   Lab Results  Component Value Date   WBC 5.9 10/24/2019   HGB 15.8 10/24/2019   HCT 47.3 10/24/2019   MCV 95 10/24/2019   PLT 172 10/24/2019   Lab Results  Component Value Date   NA 139 10/24/2019   K 4.2 10/24/2019   CO2 24 10/24/2019   GLUCOSE 61 (L) 10/24/2019   BUN 14 10/24/2019   CREATININE 1.06 10/24/2019   BILITOT 0.5 10/24/2019   ALKPHOS 75 10/24/2019   AST 19 10/24/2019   ALT 19 10/24/2019   PROT 7.3 10/24/2019   ALBUMIN 4.8 10/24/2019   CALCIUM 9.3 10/24/2019   ANIONGAP 5 09/23/2018   Lab Results  Component Value Date   CHOL 146 10/24/2019   Lab Results  Component Value Date   HDL 31 (L) 10/24/2019   Lab Results  Component Value Date   LDLCALC 90 10/24/2019   Lab Results  Component Value Date   TRIG 139 10/24/2019   No results found for: CHOLHDL No results found for: ZJIR6V     Assessment & Plan:   Problem List Items Addressed This Visit      Nervous and Auditory   Chronic bilateral low back pain with bilateral sciatica - Primary    Chronic, ongoing. Has done 2 weeks of PT and saw a chiropractor. Numbness in left leg is getting worse. 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 MRI was approved today after a peer to peer call. Discussed that he should be hearing soon to schedule the MRI and to set up an appointment with orthopedics. Continue taking the prednisone until course is completed. Discussed starting cymbalta or  amitriptyline to help with pain. He will review information on the medications and let us know in the future if he would like a prescription. Follow-up in 4 weeks.           No orders of the defined types were placed in this encounter.    Gerre Scull, NP

## 2020-08-21 ENCOUNTER — Ambulatory Visit: Payer: 59 | Admitting: Nurse Practitioner

## 2020-08-23 ENCOUNTER — Other Ambulatory Visit: Payer: Self-pay

## 2020-08-23 ENCOUNTER — Ambulatory Visit
Admission: RE | Admit: 2020-08-23 | Discharge: 2020-08-23 | Disposition: A | Discharge status: Home / Self Care | Payer: 59 | Source: Ambulatory Visit | Attending: Nurse Practitioner | Admitting: Nurse Practitioner

## 2020-08-23 DIAGNOSIS — M5442 Lumbago with sciatica, left side: Secondary | ICD-10-CM | POA: Diagnosis present

## 2020-08-23 DIAGNOSIS — G8929 Other chronic pain: Secondary | ICD-10-CM | POA: Insufficient documentation

## 2020-08-23 DIAGNOSIS — M5441 Lumbago with sciatica, right side: Secondary | ICD-10-CM | POA: Insufficient documentation

## 2020-08-24 NOTE — Addendum Note (Signed)
Addended by: Rodman Pickle A on: 08/24/2020 10:26 AM   Modules accepted: Orders

## 2020-09-15 ENCOUNTER — Other Ambulatory Visit: Payer: Self-pay

## 2020-09-15 ENCOUNTER — Ambulatory Visit (INDEPENDENT_AMBULATORY_CARE_PROVIDER_SITE_OTHER): Payer: 59 | Admitting: Orthopaedic Surgery

## 2020-09-15 ENCOUNTER — Encounter: Payer: Self-pay | Admitting: Orthopaedic Surgery

## 2020-09-15 VITALS — BP 134/82 | HR 80

## 2020-09-15 DIAGNOSIS — M5441 Lumbago with sciatica, right side: Secondary | ICD-10-CM

## 2020-09-15 DIAGNOSIS — M5442 Lumbago with sciatica, left side: Secondary | ICD-10-CM

## 2020-09-15 DIAGNOSIS — G8929 Other chronic pain: Secondary | ICD-10-CM | POA: Diagnosis not present
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
## 2020-09-24 ENCOUNTER — Ambulatory Visit: Payer: 59 | Admitting: Nurse Practitioner

## 2022-03-27 NOTE — Patient Instructions (Signed)
Syncope, Adult  Syncope is when you pass out or faint for a short time. It is caused by a sudden decrease in blood flow to the brain. This can happen for many reasons. It can sometimes happen when seeing blood, getting a shot (injection), or having pain or strong emotions. Most causes of fainting are not dangerous, but in some cases it can be a sign of a serious medical problem. If you faint, get help right away. Call your local emergency services (911 in the [place] Follow these instructions at home: Watch for any changes in your symptoms. Take these actions to stay safe and help with your symptoms: Knowing when you may be about to faint Signs that you may be about to faint include: Feeling dizzy or light-headed. It may feel like the room is spinning. Feeling weak. Feeling like you may vomit (nauseous). Seeing spots or seeing all white or all black. Having cold, clammy skin. Feeling warm and sweaty. Hearing ringing in the ears. If you start to feel like you might faint, sit or lie down right away. If sitting, lower your head down between your legs. If lying down, raise (elevate) your feet above the level of your heart. Breathe deeply and steadily. Wait until all of the symptoms are gone. Have someone stay with you until you feel better. Medicines Take over-the-counter and prescription medicines only as told by your doctor. If you are taking blood pressure or heart medicine, sit up and stand up slowly. Spend a few minutes getting ready to sit and then stand. This can help you feel less dizzy. Lifestyle Do not drive, use machinery, or play sports until your doctor says it is okay. Do not drink alcohol. Do not smoke or use any products that contain nicotine or tobacco. If you need help quitting, ask your doctor. Avoid hot tubs and saunas. General instructions Talk with your doctor about your symptoms. You may need to have testing to help find the cause. Drink enough fluid to keep your pee  (urine) pale yellow. Avoid standing for a long time. If you must stand for a long time, do movements such as: Moving your legs. Crossing your legs. Flexing and stretching your leg muscles. Squatting. Keep all follow-up visits. Contact a doctor if: You have episodes of near fainting. Get help right away if: You pass out or faint. You hit your head or are injured after fainting. You have any of these symptoms: Fast or uneven heartbeats (palpitations). Pain in your chest, belly, or back. Shortness of breath. You have jerky movements that you cannot control (seizure). You have a very bad headache. You are confused. You have problems with how you see (vision). You are very weak. You have trouble walking. You are bleeding from your mouth or your butt (rectum). You have black or tarry poop (stool). These symptoms may be an emergency. Get help right away. Call your local emergency services (911 in the [place] Do not wait to see if the symptoms will go away. Do not drive yourself to the hospital. Summary Syncope is when you pass out or faint for a short time. It is caused by a sudden decrease in blood flow to the brain. Signs that you may be about to faint include feeling dizzy or light-headed, feeling like you may vomit, seeing all white or all black, or having cold, clammy skin. If you start to feel like you might faint, sit or lie down right away. Lower your head if sitting, or raise (elevate)   your feet if lying down. Breathe deeply and steadily. Wait until all of the symptoms are gone. This information is not intended to replace advice given to you by your health care provider. Make sure you discuss any questions you have with your health care provider. Document Revised: 08/27/2020 Document Reviewed: 08/27/2020 Elsevier Patient Education  2023 Elsevier Inc.  

## 2022-03-30 ENCOUNTER — Ambulatory Visit (INDEPENDENT_AMBULATORY_CARE_PROVIDER_SITE_OTHER): Payer: Self-pay | Admitting: Nurse Practitioner

## 2022-03-30 ENCOUNTER — Encounter: Payer: Self-pay | Admitting: Nurse Practitioner

## 2022-03-30 VITALS — BP 124/85 | HR 73 | Temp 98.1°F | Ht 73.03 in | Wt 199.1 lb

## 2022-03-30 DIAGNOSIS — H9313 Tinnitus, bilateral: Secondary | ICD-10-CM

## 2022-03-30 DIAGNOSIS — M533 Sacrococcygeal disorders, not elsewhere classified: Secondary | ICD-10-CM | POA: Insufficient documentation

## 2022-03-30 DIAGNOSIS — R55 Syncope and collapse: Secondary | ICD-10-CM

## 2022-03-30 DIAGNOSIS — H9319 Tinnitus, unspecified ear: Secondary | ICD-10-CM | POA: Insufficient documentation

## 2022-03-30 NOTE — Progress Notes (Signed)
BP 124/85   Pulse 73   Temp 98.1 F (36.7 C) (Oral)   Ht 6' 1.03" (1.855 m)   Wt 199 lb 1.6 oz (90.3 kg)   SpO2 98%   BMI 26.25 kg/m    Subjective:    Patient ID: Joel Mills, male    DOB: [birth year], 41 y.o.   MRN: [id number]  HPI: Joel Mills is a 41 y.o. male  Chief Complaint  Patient presents with   Vasvogal syncope   Bruise    At base of tail bone for about a week. Patient states it is very painful when he sits   ER FOLLOW UP Was in ER at [facility] on 03/25/22 for syncopal episode.  Was getting up from table, did not recall what happened.  Prior to episode had picked up breakfast from [employer], eating sandwich and bit his tongue -- the pain made him nauseous instantly -- stood up and everything started feeling fuzzy and was on the ground. Shoulder and head hit counterpoint.  Had been a heavier MJ smoker, but over past month had been reducing -- did smoke some that morning.    History of syncope years ago 1 1/2 hours after a steroid shot years ago.  Endorses ringing in ears for over 6 months, but over past 2 months has been more consistent and loud -- muffles other sounds.  Work setting can be loud in [employer], but uses [employer].    Imaging was all reassuring in hospital.  Labs showed elevation in glucose, but had just ate and had IV fluids on route via EMS.  Overall remaining labs reassuring. Since episode has had no further episodes, but did nick self last night while doing self care, went to pull a piece of skin off -- felt pain and nausea, so he sat down against wall and sipped water -- felt better.    Currently has bruise to tailbone area, which was present prior to recent episode of syncope.  Does not recall any injuries to tailbone at time.  Tailbone is sensitive to touch.  Kneeling down and foot landing on tailbone caused discomfort.  Was going to chiropractor -- has not been for awhile.   Time since discharge: 03/25/22 Hospital/facility: [facility] Diagnosis:  syncope Procedures/tests: labs and imaging Consultants: none New medications: none Discharge instructions:  follow-up with PCP Status: stable   Relevant past medical, surgical, family and social history reviewed and updated as indicated. Interim medical history since our last visit reviewed. Allergies and medications reviewed and updated.  Review of Systems  Constitutional:  Negative for activity change, diaphoresis, fatigue and fever.  Respiratory:  Negative for cough, chest tightness, shortness of breath and wheezing.   Cardiovascular:  Negative for chest pain, palpitations and leg swelling.  Gastrointestinal: Negative.   Endocrine: Negative for cold intolerance, heat intolerance, polydipsia, polyphagia and polyuria.  Musculoskeletal:  Positive for arthralgias.  Neurological:  Positive for syncope and light-headedness. Negative for dizziness, weakness, numbness and headaches.  Psychiatric/Behavioral: Negative.      Per HPI unless specifically indicated above     Objective:    BP 124/85   Pulse 73   Temp 98.1 F (36.7 C) (Oral)   Ht 6' 1.03" (1.855 m)   Wt 199 lb 1.6 oz (90.3 kg)   SpO2 98%   BMI 26.25 kg/m   Wt Readings from Last 3 Encounters:  03/30/22 199 lb 1.6 oz (90.3 kg)  08/13/20 200 lb (90.7 kg)  08/13/20 200 lb 3.2 oz (  90.8 kg)    Physical Exam Vitals and nursing note reviewed.  Constitutional:      General: He is awake. He is not in acute distress.    Appearance: He is well-developed and well-groomed. He is not ill-appearing or toxic-appearing.  HENT:     Head: Normocephalic and atraumatic.     Right Ear: Hearing, tympanic membrane, ear canal and external ear normal. No drainage. There is no impacted cerumen.     Left Ear: Hearing, tympanic membrane, ear canal and external ear normal. No drainage. There is no impacted cerumen.     Nose: Nose normal.     Mouth/Throat:     Mouth: Mucous membranes are moist.     Pharynx: Uvula midline.  Eyes:      General: Lids are normal.        Right eye: No discharge.        Left eye: No discharge.     Conjunctiva/sclera: Conjunctivae normal.     Pupils: Pupils are equal, round, and reactive to light.  Neck:     Thyroid: No thyromegaly.     Vascular: No carotid bruit.  Cardiovascular:     Rate and Rhythm: Normal rate and regular rhythm.     Heart sounds: Normal heart sounds, S1 normal and S2 normal. No murmur heard.    No gallop.  Pulmonary:     Effort: Pulmonary effort is normal. No accessory muscle usage or respiratory distress.     Breath sounds: Normal breath sounds.  Abdominal:     General: Bowel sounds are normal.     Palpations: Abdomen is soft. There is no hepatomegaly or splenomegaly.  Musculoskeletal:        General: Normal range of motion.     Cervical back: Normal range of motion and neck supple.     Right lower leg: No edema.     Left lower leg: No edema.  Lymphadenopathy:     Cervical: No cervical adenopathy.  Skin:    General: Skin is warm and dry.     Capillary Refill: Capillary refill takes less than 2 seconds.     Findings: No rash.  Neurological:     Mental Status: He is alert and oriented to person, place, and time.     Cranial Nerves: Cranial nerves 2-12 are intact.     Coordination: Coordination is intact.     Gait: Gait is intact.     Deep Tendon Reflexes: Reflexes are normal and symmetric.     Reflex Scores:      Brachioradialis reflexes are 2+ on the right side and 2+ on the left side.      Patellar reflexes are 2+ on the right side and 2+ on the left side. Psychiatric:        Mood and Affect: Mood normal.        Behavior: Behavior normal. Behavior is cooperative.        Thought Content: Thought content normal.        Judgment: Judgment normal.    Results for orders placed or performed during the hospital encounter of 04/16/20  Urinalysis, Complete w Microscopic Urine, Clean Catch  Result Value Ref Range   Color, Urine YELLOW YELLOW   APPearance  CLEAR CLEAR   Specific Gravity, Urine 1.020 1.005 - 1.030   pH 5.0 5.0 - 8.0   Glucose, UA NEGATIVE NEGATIVE mg/dL   Hgb urine dipstick NEGATIVE NEGATIVE   Bilirubin Urine NEGATIVE NEGATIVE   Ketones, ur NEGATIVE  NEGATIVE mg/dL   Protein, ur NEGATIVE NEGATIVE mg/dL   Nitrite NEGATIVE NEGATIVE   Leukocytes,Ua NEGATIVE NEGATIVE   Squamous Epithelial / LPF NONE SEEN 0 - 5   WBC, UA NONE SEEN 0 - 5 WBC/hpf   RBC / HPF 0-5 0 - 5 RBC/hpf   Bacteria, UA NONE SEEN NONE SEEN      Assessment & Plan:   Problem List Items Addressed This Visit       Other   Pain in the coccyx    Acute for weeks, no injuries.  No red flags on exam.  Recommend he return to chiropractor.      Syncope - Primary    Acute episode recently and history of similar + increase tinnitus to ears.  Recheck labs today to include A1c, CMP, CBC, TSH.  Referral to ENT for further recommendations.  Recommend increased hydration at home.        Relevant Orders   Comprehensive metabolic panel   CBC with Differential/Platelet   TSH   HgB A1c   Ambulatory referral to ENT   Tinnitus    For several months, but past two months with increase.  Referral to ENT placed.        Follow up plan: Return if symptoms worsen or fail to improve.

## 2022-03-30 NOTE — Assessment & Plan Note (Signed)
For several months, but past two months with increase.  Referral to ENT placed.

## 2022-03-30 NOTE — Assessment & Plan Note (Signed)
Acute for weeks, no injuries.  No red flags on exam.  Recommend he return to chiropractor.

## 2022-03-30 NOTE — Assessment & Plan Note (Signed)
Acute episode recently and history of similar + increase tinnitus to ears.  Recheck labs today to include A1c, CMP, CBC, TSH.  Referral to ENT for further recommendations.  Recommend increased hydration at home.

## 2022-03-31 LAB — CBC WITH DIFFERENTIAL/PLATELET
Basophils Absolute: 0 10*3/uL (ref 0.0–0.2)
Basos: 0 %
EOS (ABSOLUTE): 0.2 10*3/uL (ref 0.0–0.4)
Eos: 3 %
Hematocrit: 46.4 % (ref 37.5–51.0)
Hemoglobin: 16 g/dL (ref 13.0–17.7)
Immature Grans (Abs): 0 10*3/uL (ref 0.0–0.1)
Immature Granulocytes: 0 %
Lymphocytes Absolute: 2.3 10*3/uL (ref 0.7–3.1)
Lymphs: 33 %
MCH: 31.4 pg (ref 26.6–33.0)
MCHC: 34.5 g/dL (ref 31.5–35.7)
MCV: 91 fL (ref 79–97)
Monocytes Absolute: 0.6 10*3/uL (ref 0.1–0.9)
Monocytes: 8 %
Neutrophils Absolute: 3.8 10*3/uL (ref 1.4–7.0)
Neutrophils: 56 %
Platelets: 199 10*3/uL (ref 150–450)
RBC: 5.09 x10E6/uL (ref 4.14–5.80)
RDW: 12.9 % (ref 11.6–15.4)
WBC: 7 10*3/uL (ref 3.4–10.8)

## 2022-03-31 LAB — COMPREHENSIVE METABOLIC PANEL
ALT: 22 IU/L (ref 0–44)
AST: 16 IU/L (ref 0–40)
Albumin/Globulin Ratio: 2.1 (ref 1.2–2.2)
Albumin: 5.1 g/dL (ref 4.1–5.1)
Alkaline Phosphatase: 71 IU/L (ref 44–121)
BUN/Creatinine Ratio: 15 (ref 9–20)
BUN: 16 mg/dL (ref 6–24)
Bilirubin Total: 0.7 mg/dL (ref 0.0–1.2)
CO2: 26 mmol/L (ref 20–29)
Calcium: 10.1 mg/dL (ref 8.7–10.2)
Chloride: 99 mmol/L (ref 96–106)
Creatinine, Ser: 1.09 mg/dL (ref 0.76–1.27)
Globulin, Total: 2.4 g/dL (ref 1.5–4.5)
Glucose: 83 mg/dL (ref 70–99)
Potassium: 4.3 mmol/L (ref 3.5–5.2)
Sodium: 138 mmol/L (ref 134–144)
Total Protein: 7.5 g/dL (ref 6.0–8.5)
eGFR: 88 mL/min/{1.73_m2} (ref >59)

## 2022-03-31 LAB — HEMOGLOBIN A1C
Est. average glucose Bld gHb Est-mCnc: 88 mg/dL
Hgb A1c MFr Bld: 4.7 % — ABNORMAL LOW (ref 4.8–5.6)

## 2022-03-31 LAB — TSH: TSH: 1.38 u[IU]/mL (ref 0.450–4.500)

## 2022-03-31 NOTE — Progress Notes (Signed)
Contacted via [place] -- however appears he may not check MyChart, so please call too to ensure he received:  Good afternoon Joel Mills, your labs have returned and overall everything looks great.  Kidney function, creatinine and eGFR, remains normal, as is liver function, AST and ALT. Thyroid, TSH, is normal.  A1c shows no diabetes or prediabetes.  Any questions? Keep being awesome!!  Thank you for allowing me to participate in your care.  I appreciate you. Kindest regards, Layton Tappan

## 2022-05-10 NOTE — Patient Instructions (Incomplete)

## 2022-05-13 ENCOUNTER — Encounter: Payer: Self-pay | Admitting: Nurse Practitioner

## 2022-05-13 DIAGNOSIS — F410 Panic disorder [episodic paroxysmal anxiety] without agoraphobia: Secondary | ICD-10-CM

## 2022-05-13 DIAGNOSIS — Z13220 Encounter for screening for lipoid disorders: Secondary | ICD-10-CM

## 2022-05-13 DIAGNOSIS — Z0000 Encounter for general adult medical examination without abnormal findings: Secondary | ICD-10-CM

## 2022-06-06 ENCOUNTER — Encounter: Payer: Self-pay | Admitting: Nurse Practitioner

## 2022-06-19 NOTE — Patient Instructions (Signed)

## 2022-06-21 ENCOUNTER — Encounter: Payer: Self-pay | Admitting: Nurse Practitioner

## 2022-06-21 ENCOUNTER — Ambulatory Visit (INDEPENDENT_AMBULATORY_CARE_PROVIDER_SITE_OTHER): Payer: Medicaid Other | Admitting: Nurse Practitioner

## 2022-06-21 VITALS — BP 132/81 | HR 88 | Temp 98.4°F | Ht 73.03 in | Wt 210.3 lb

## 2022-06-21 DIAGNOSIS — Z0000 Encounter for general adult medical examination without abnormal findings: Secondary | ICD-10-CM | POA: Diagnosis not present

## 2022-06-21 DIAGNOSIS — R5383 Other fatigue: Secondary | ICD-10-CM | POA: Diagnosis not present

## 2022-06-21 DIAGNOSIS — L918 Other hypertrophic disorders of the skin: Secondary | ICD-10-CM | POA: Insufficient documentation

## 2022-06-21 DIAGNOSIS — R41840 Attention and concentration deficit: Secondary | ICD-10-CM | POA: Diagnosis not present

## 2022-06-21 DIAGNOSIS — F419 Anxiety disorder, unspecified: Secondary | ICD-10-CM

## 2022-06-21 DIAGNOSIS — Z136 Encounter for screening for cardiovascular disorders: Secondary | ICD-10-CM | POA: Diagnosis not present

## 2022-06-21 DIAGNOSIS — Z13220 Encounter for screening for lipoid disorders: Secondary | ICD-10-CM

## 2022-06-21 DIAGNOSIS — M25552 Pain in left hip: Secondary | ICD-10-CM

## 2022-06-21 DIAGNOSIS — F32A Depression, unspecified: Secondary | ICD-10-CM

## 2022-06-21 DIAGNOSIS — Z8711 Personal history of peptic ulcer disease: Secondary | ICD-10-CM | POA: Diagnosis not present

## 2022-06-21 DIAGNOSIS — F410 Panic disorder [episodic paroxysmal anxiety] without agoraphobia: Secondary | ICD-10-CM

## 2022-06-21 DIAGNOSIS — G8929 Other chronic pain: Secondary | ICD-10-CM

## 2022-06-21 NOTE — Assessment & Plan Note (Signed)
Chronic and ongoing.  Discussed possible referral to ortho or PT due to ongoing pain + imaging.  At this time he wishes to visit with chiropractor first and if ongoing pain will consider referral.  Recommend simple treatment at home, including Tylenol, heat, ice, and Voltaren gel.

## 2022-06-21 NOTE — Assessment & Plan Note (Signed)
Under right axilla x 1 and posterior neck. These are causing pain and getting irritated.  Will plan on return in upcoming weeks for removal.

## 2022-06-21 NOTE — Assessment & Plan Note (Signed)
Ongoing issue per patient.  Obtain labs today and obtain early morning testosterone level outpatient.  Referral to attention specialist for testing.  Discussed at length with him and that if he tests positive and they get on good medication regimen, then in future could take over this in office but would require yearly drug screen, every 3 month visits, and controlled substance agreement if placed on controlled substance.

## 2022-06-21 NOTE — Assessment & Plan Note (Signed)
Check CBC today.  

## 2022-06-21 NOTE — Progress Notes (Signed)
BP 132/81   Pulse 88   Temp 98.4 F (36.9 C) (Oral)   Ht 6' 1.03" (1.855 m)   Wt 210 lb 4.8 oz (95.4 kg)   SpO2 99%   BMI 27.72 kg/m    Subjective:    Patient ID: Joel Mills, male    DOB: 09-07-1980, 42 y.o.   MRN: [id number]  HPI: Clarice Leer is a 42 y.o. male presenting on 06/21/2022 for comprehensive medical examination. Current medical complaints include: fatigue, hip pain, and skin tags + concentration issues  He currently lives with: significant other Interim Problems from his last visit: as above  Reports ongoing left hip pain for awhile off and on -- he is thinking about going to chiropractor to be seen for this.  Last night his whole left leg was tingling with this discomfort.  Sometimes if he extends left leg the hip will pop.  .    Is having discomfort under right axilla and back of neck due to skin tags.  FATIGUE & MOOD No current medications.  He reports being more fatigue and lethargic in past months. Has a decreased sexual drive as well -- this is concerning for him.  Has not had testosterone checked in past.  History of vasectomy 14 to 15 years ago.    Depressed mood: occasional down mood Anxious mood: no Anhedonia: no Significant weight loss or gain: yes some gain Insomnia: yes hard to fall asleep Fatigue: yes Feelings of worthlessness or guilt: no Impaired concentration/indecisiveness: yes -- struggling keeping focus and feeling overwhelmed with simple Suicidal ideations: no Hopelessness: no Crying spells: no    06/21/2022    4:35 PM 03/30/2022    2:40 PM 08/13/2020    8:35 AM 08/03/2020    9:49 AM 07/23/2020    8:51 AM  Depression screen PHQ 2/9  Decreased Interest 2 1 3 $ 0 0  Down, Depressed, Hopeless 1 1 3 $ 0 0  PHQ - 2 Score 3 2 6 $ 0 0  Altered sleeping 3 2 3  1  $ Tired, decreased energy 2 2 3  $ 0  Change in appetite 1 0 1  0  Feeling bad or failure about yourself  0 0 1  0  Trouble concentrating 2 0 2  0  Moving slowly or fidgety/restless 1 0 0  0   Suicidal thoughts 0 0 0  0  PHQ-9 Score 12 6 16  1  $ Difficult doing work/chores Somewhat difficult Not difficult at all   Not difficult at all       06/21/2022    4:36 PM 03/30/2022    2:40 PM 08/13/2020    8:37 AM 10/24/2019    9:25 AM  GAD 7 : Generalized Anxiety Score  Nervous, Anxious, on Edge 1 1 3 2  $ Control/stop worrying 1 0 0 0  Worry too much - different things 0 0 0 1  Trouble relaxing 2 1 3 2  $ Restless 2 1 0 3  Easily annoyed or irritable 2 1 3 2  $ Afraid - awful might happen 0 0 0 0  Total GAD 7 Score 8 4 9 10  $ Anxiety Difficulty Somewhat difficult Not difficult at all  Somewhat difficult   Functional Status Survey: Is the patient deaf or have difficulty hearing?: No Does the patient have difficulty seeing, even when wearing glasses/contacts?: No Does the patient have difficulty concentrating, remembering, or making decisions?: No Does the patient have difficulty walking or climbing stairs?: No Does the patient have difficulty dressing  or bathing?: No Does the patient have difficulty doing errands alone such as visiting a doctor's office or shopping?: No     08/03/2020    9:49 AM 08/13/2020    8:34 AM 08/13/2020    4:24 PM 03/30/2022    2:40 PM 06/21/2022    4:40 PM  Fall Risk  Falls in the past year? 0 0  1 0  Was there an injury with Fall?    1 0  Fall Risk Category Calculator     0  (RETIRED) Patient Fall Risk Level   Low fall risk    Patient at Risk for Falls Due to    History of fall(s) No Fall Risks  Fall risk Follow up    Falls evaluation completed Falls prevention discussed    Past Medical History:  Past Medical History:  Diagnosis Date   Anemia    42 years old   Asthma    Depression     Surgical History:  Past Surgical History:  Procedure Laterality Date   ADENOIDECTOMY     ESOPHAGOGASTRODUODENOSCOPY (EGD) WITH PROPOFOL N/A 01/07/2018   Procedure: ESOPHAGOGASTRODUODENOSCOPY (EGD) WITH PROPOFOL;  Surgeon: Toledo, Benay Pike, MD;  Location: [facility]  ENDOSCOPY;  Service: Gastroenterology;  Laterality: N/A;   VASECTOMY      Medications:  Current Outpatient Medications on File Prior to Visit  Medication Sig   [DISCONTINUED] pantoprazole (PROTONIX) 40 MG tablet Take 1 tablet (40 mg total) by mouth 2 (two) times daily before a meal.   No current facility-administered medications on file prior to visit.    Allergies:  Allergies  Allergen Reactions   Flexeril [Cyclobenzaprine] Other (See Comments)    Mood swings per patient   Nsaids Hives    Social History:  Social History   Socioeconomic History   Marital status: Married    Spouse name: Not on file   Number of children: 2   Years of education: Not on file   Highest education level: Not on file  Occupational History   Occupation: Nature conservation officer  Tobacco Use   Smoking status: Former    Types: Cigarettes    Quit date: 11/30/2018    Years since quitting: 3.5   Smokeless tobacco: Never  Vaping Use   Vaping Use: Never used  Substance and Sexual Activity   Alcohol use: Yes    Comment: socially   Drug use: Not Currently    Frequency: 3.0 times per week    Types: Marijuana   Sexual activity: Yes  Other Topics Concern   Not on file  Social History Narrative   Not on file   Social Determinants of Health   Financial Resource Strain: Not on file  Food Insecurity: Not on file  Transportation Needs: Not on file  Physical Activity: Not on file  Stress: Not on file  Social Connections: Not on file  Intimate Partner Violence: Not on file   Social History   Tobacco Use  Smoking Status Former   Types: Cigarettes   Quit date: 11/30/2018   Years since quitting: 3.5  Smokeless Tobacco Never   Social History   Substance and Sexual Activity  Alcohol Use Yes   Comment: socially    Family History:  Family History  Problem Relation Age of Onset   Diabetes Mother    Hypertension Father    Depression Father    Cancer Sister    Depression Sister     Past  medical history, surgical history, medications, allergies, family history and  social history reviewed with patient today and changes made to appropriate areas of the chart.   ROS All other ROS negative except what is listed above and in the HPI.      Objective:    BP 132/81   Pulse 88   Temp 98.4 F (36.9 C) (Oral)   Ht 6' 1.03" (1.855 m)   Wt 210 lb 4.8 oz (95.4 kg)   SpO2 99%   BMI 27.72 kg/m   Wt Readings from Last 3 Encounters:  06/21/22 210 lb 4.8 oz (95.4 kg)  03/30/22 199 lb 1.6 oz (90.3 kg)  08/13/20 200 lb (90.7 kg)    Physical Exam Vitals and nursing note reviewed.  Constitutional:      General: He is awake. He is not in acute distress.    Appearance: He is well-developed and well-groomed. He is not ill-appearing or toxic-appearing.  HENT:     Head: Normocephalic and atraumatic.     Right Ear: Hearing, tympanic membrane, ear canal and external ear normal. No drainage.     Left Ear: Hearing, tympanic membrane, ear canal and external ear normal. No drainage.     Nose: Nose normal.     Mouth/Throat:     Pharynx: Uvula midline.  Eyes:     General: Lids are normal.        Right eye: No discharge.        Left eye: No discharge.     Extraocular Movements: Extraocular movements intact.     Conjunctiva/sclera: Conjunctivae normal.     Pupils: Pupils are equal, round, and reactive to light.     Visual Fields: Right eye visual fields normal and left eye visual fields normal.  Neck:     Thyroid: No thyromegaly.     Vascular: No carotid bruit or JVD.     Trachea: Trachea normal.  Cardiovascular:     Rate and Rhythm: Normal rate and regular rhythm.     Heart sounds: Normal heart sounds, S1 normal and S2 normal. No murmur heard.    No gallop.  Pulmonary:     Effort: Pulmonary effort is normal. No accessory muscle usage or respiratory distress.     Breath sounds: Normal breath sounds.  Abdominal:     General: Bowel sounds are normal.     Palpations: Abdomen is soft.  There is no hepatomegaly or splenomegaly.     Tenderness: There is no abdominal tenderness.  Musculoskeletal:        General: Normal range of motion.     Cervical back: Normal range of motion and neck supple.     Right lower leg: No edema.     Left lower leg: No edema.  Lymphadenopathy:     Head:     Right side of head: No submental, submandibular, tonsillar, preauricular or posterior auricular adenopathy.     Left side of head: No submental, submandibular, tonsillar, preauricular or posterior auricular adenopathy.     Cervical: No cervical adenopathy.  Skin:    General: Skin is warm and dry.     Capillary Refill: Capillary refill takes less than 2 seconds.     Findings: No rash.     Comments: One approx 1 cm raised skin tag under right axilla and one 1/2 cm raised skin tag to posterior neck.  Neurological:     Mental Status: He is alert and oriented to person, place, and time.     Gait: Gait is intact.     Deep Tendon Reflexes:  Reflexes are normal and symmetric.     Reflex Scores:      Brachioradialis reflexes are 2+ on the right side and 2+ on the left side.      Patellar reflexes are 2+ on the right side and 2+ on the left side. Psychiatric:        Attention and Perception: Attention normal.        Mood and Affect: Mood normal.        Speech: Speech normal.        Behavior: Behavior normal. Behavior is cooperative.        Thought Content: Thought content normal.        Cognition and Memory: Cognition normal.    Results for orders placed or performed in visit on 03/30/22  Comprehensive metabolic panel  Result Value Ref Range   Glucose 83 70 - 99 mg/dL   BUN 16 6 - 24 mg/dL   Creatinine, Ser 1.09 0.76 - 1.27 mg/dL   eGFR 88 >59 mL/min/1.73   BUN/Creatinine Ratio 15 9 - 20   Sodium 138 134 - 144 mmol/L   Potassium 4.3 3.5 - 5.2 mmol/L   Chloride 99 96 - 106 mmol/L   CO2 26 20 - 29 mmol/L   Calcium 10.1 8.7 - 10.2 mg/dL   Total Protein 7.5 6.0 - 8.5 g/dL   Albumin 5.1  4.1 - 5.1 g/dL   Globulin, Total 2.4 1.5 - 4.5 g/dL   Albumin/Globulin Ratio 2.1 1.2 - 2.2   Bilirubin Total 0.7 0.0 - 1.2 mg/dL   Alkaline Phosphatase 71 44 - 121 IU/L   AST 16 0 - 40 IU/L   ALT 22 0 - 44 IU/L  CBC with Differential/Platelet  Result Value Ref Range   WBC 7.0 3.4 - 10.8 x10E3/uL   RBC 5.09 4.14 - 5.80 x10E6/uL   Hemoglobin 16.0 13.0 - 17.7 g/dL   Hematocrit 46.4 37.5 - 51.0 %   MCV 91 79 - 97 fL   MCH 31.4 26.6 - 33.0 pg   MCHC 34.5 31.5 - 35.7 g/dL   RDW 12.9 11.6 - 15.4 %   Platelets 199 150 - 450 x10E3/uL   Neutrophils 56 Not Estab. %   Lymphs 33 Not Estab. %   Monocytes 8 Not Estab. %   Eos 3 Not Estab. %   Basos 0 Not Estab. %   Neutrophils Absolute 3.8 1.4 - 7.0 x10E3/uL   Lymphocytes Absolute 2.3 0.7 - 3.1 x10E3/uL   Monocytes Absolute 0.6 0.1 - 0.9 x10E3/uL   EOS (ABSOLUTE) 0.2 0.0 - 0.4 x10E3/uL   Basophils Absolute 0.0 0.0 - 0.2 x10E3/uL   Immature Granulocytes 0 Not Estab. %   Immature Grans (Abs) 0.0 0.0 - 0.1 x10E3/uL  TSH  Result Value Ref Range   TSH 1.380 0.450 - 4.500 uIU/mL  HgB A1c  Result Value Ref Range   Hgb A1c MFr Bld 4.7 (L) 4.8 - 5.6 %   Est. average glucose Bld gHb Est-mCnc 88 mg/dL      Assessment & Plan:   Problem List Items Addressed This Visit       Musculoskeletal and Integument   Cutaneous skin tags    Under right axilla x 1 and posterior neck. These are causing pain and getting irritated.  Will plan on return in upcoming weeks for removal.        Other   Anxiety and depression - Primary   Relevant Orders   TSH   Chronic left hip  pain    Chronic and ongoing.  Discussed possible referral to ortho or PT due to ongoing pain + imaging.  At this time he wishes to visit with chiropractor first and if ongoing pain will consider referral.  Recommend simple treatment at home, including Tylenol, heat, ice, and Voltaren gel.        Concentration deficit    Ongoing issue per patient.  Obtain labs today and obtain early  morning testosterone level outpatient.  Referral to attention specialist for testing.  Discussed at length with him and that if he tests positive and they get on good medication regimen, then in future could take over this in office but would require yearly drug screen, every 3 month visits, and controlled substance agreement if placed on controlled substance.        Relevant Orders   Ambulatory referral to Psychiatry   Fatigue    Reports as ongoing for months with decreased libido.  Will obtain outpatient early morning testosterone labs in upcoming weeks.  Discussed with him if this is low we will recheck in 4 weeks and if ongoing lows will consider referral to urology.  Check TSH, CMP, CBC today.      Relevant Orders   Testosterone, free, total([facility])   History of gastric ulcer    Check CBC today.      Relevant Orders   CBC with Differential/Platelet   Comprehensive metabolic panel   Other Visit Diagnoses     Encounter for lipid screening for cardiovascular disease       Lipid panel in office today.   Relevant Orders   Comprehensive metabolic panel   Lipid Panel w/o Chol/HDL Ratio   Encounter for annual physical exam       Annual physical today with labs and health maintenance reviewed, discussed with patient.       Discussed aspirin prophylaxis for myocardial infarction prevention and decision was it was not indicated  LABORATORY TESTING:  Health maintenance labs ordered today as discussed above.   IMMUNIZATIONS:   - Tdap: Tetanus vaccination status reviewed: last tetanus booster within 10 years. - Influenza: Refused - Pneumovax: Not applicable - Prevnar: Not applicable - Zostavax vaccine: Not applicable  SCREENING: - Colonoscopy: Not applicable  Discussed with patient purpose of the colonoscopy is to detect colon cancer at curable precancerous or early stages   - AAA Screening: Not applicable  -Hearing Test: Not applicable  -Spirometry: Not applicable    PATIENT COUNSELING:    Sexuality: Discussed sexually transmitted diseases, partner selection, use of condoms, avoidance of unintended pregnancy  and contraceptive alternatives.   Advised to avoid cigarette smoking.  I discussed with the patient that most people either abstain from alcohol or drink within safe limits (<=14/week and <=4 drinks/occasion for males, <=7/weeks and <= 3 drinks/occasion for females) and that the risk for alcohol disorders and other health effects rises proportionally with the number of drinks per week and how often a drinker exceeds daily limits.  Discussed cessation/primary prevention of drug use and availability of treatment for abuse.   Diet: Encouraged to adjust caloric intake to maintain  or achieve ideal body weight, to reduce intake of dietary saturated fat and total fat, to limit sodium intake by avoiding high sodium foods and not adding table salt, and to maintain adequate dietary potassium and calcium preferably from fresh fruits, vegetables, and low-fat dairy products.    Stressed the importance of regular exercise  Injury prevention: Discussed safety belts, safety helmets, smoke detector, smoking  near bedding or upholstery.   Dental health: Discussed importance of regular tooth brushing, flossing, and dental visits.   Follow up plan: NEXT PREVENTATIVE PHYSICAL DUE IN 1 YEAR. Return in about 2 weeks (around 07/05/2022) for Skin tag removal -- also need first thing in morning lab visit for testosterone.

## 2022-06-21 NOTE — Assessment & Plan Note (Signed)
Reports as ongoing for months with decreased libido.  Will obtain outpatient early morning testosterone labs in upcoming weeks.  Discussed with him if this is low we will recheck in 4 weeks and if ongoing lows will consider referral to urology.  Check TSH, CMP, CBC today.

## 2022-06-22 LAB — CBC WITH DIFFERENTIAL/PLATELET
Basophils Absolute: 0 10*3/uL (ref 0.0–0.2)
Basos: 0 %
EOS (ABSOLUTE): 0.1 10*3/uL (ref 0.0–0.4)
Eos: 2 %
Hematocrit: 41.1 % (ref 37.5–51.0)
Hemoglobin: 14.5 g/dL (ref 13.0–17.7)
Immature Grans (Abs): 0 10*3/uL (ref 0.0–0.1)
Immature Granulocytes: 0 %
Lymphocytes Absolute: 0.7 10*3/uL (ref 0.7–3.1)
Lymphs: 11 %
MCH: 31.8 pg (ref 26.6–33.0)
MCHC: 35.3 g/dL (ref 31.5–35.7)
MCV: 90 fL (ref 79–97)
Monocytes Absolute: 0.6 10*3/uL (ref 0.1–0.9)
Monocytes: 10 %
Neutrophils Absolute: 4.8 10*3/uL (ref 1.4–7.0)
Neutrophils: 77 %
Platelets: 165 10*3/uL (ref 150–450)
RBC: 4.56 x10E6/uL (ref 4.14–5.80)
RDW: 13.1 % (ref 11.6–15.4)
WBC: 6.3 10*3/uL (ref 3.4–10.8)

## 2022-06-22 LAB — LIPID PANEL W/O CHOL/HDL RATIO
Cholesterol, Total: 137 mg/dL (ref 100–199)
HDL: 27 mg/dL — ABNORMAL LOW (ref >39)
LDL Chol Calc (NIH): 91 mg/dL (ref 0–99)
Triglycerides: 103 mg/dL (ref 0–149)
VLDL Cholesterol Cal: 19 mg/dL (ref 5–40)

## 2022-06-22 LAB — COMPREHENSIVE METABOLIC PANEL
ALT: 19 IU/L (ref 0–44)
AST: 18 IU/L (ref 0–40)
Albumin/Globulin Ratio: 2.3 — ABNORMAL HIGH (ref 1.2–2.2)
Albumin: 4.8 g/dL (ref 4.1–5.1)
Alkaline Phosphatase: 85 IU/L (ref 44–121)
BUN/Creatinine Ratio: 12 (ref 9–20)
BUN: 15 mg/dL (ref 6–24)
Bilirubin Total: 0.7 mg/dL (ref 0.0–1.2)
CO2: 24 mmol/L (ref 20–29)
Calcium: 9.6 mg/dL (ref 8.7–10.2)
Chloride: 98 mmol/L (ref 96–106)
Creatinine, Ser: 1.29 mg/dL — ABNORMAL HIGH (ref 0.76–1.27)
Globulin, Total: 2.1 g/dL (ref 1.5–4.5)
Glucose: 96 mg/dL (ref 70–99)
Potassium: 3.9 mmol/L (ref 3.5–5.2)
Sodium: 137 mmol/L (ref 134–144)
Total Protein: 6.9 g/dL (ref 6.0–8.5)
eGFR: 72 mL/min/{1.73_m2} (ref >59)

## 2022-06-22 LAB — TSH: TSH: 0.774 u[IU]/mL (ref 0.450–4.500)

## 2022-06-22 NOTE — Progress Notes (Signed)
Contacted via [place] morning Joseguadalupe, your labs have returned, overall these are stable.  No acute findings on labs.  Any questions? Keep being amazing!!  Thank you for allowing me to participate in your care.  I appreciate you. Kindest regards, Tan Clopper

## 2022-07-03 NOTE — Patient Instructions (Signed)
Skin Tag, Adult A skin tag (acrochordon) is a soft, extra growth of skin. Most skin tags are skin-colored and rarely bigger than a pencil eraser. They often form in areas where there is frequent rubbing, or friction, on the skin. This may be where there are folds in the skin, such as: The eyelids. The neck. The armpits. The groin. Skin tags are not dangerous, and they do not spread from person to person (are not contagious). You may have one skin tag or many. Skin tags do not need treatment. However, your health care provider may recommend removing a skin tag if it: Gets irritated from clothing or jewelry. Bleeds. Is visible and unsightly. What are the causes? This condition is linked to: Increasing age. Pregnancy. Diabetes. Obesity. What are the signs or symptoms? Skin tags usually do not cause symptoms unless they get irritated by items touching your skin, such as clothing or jewelry. When this happens, you may have pain, itching, or bleeding. How is this diagnosed? This condition is diagnosed with an evaluation from your health care provider. No testing is needed for diagnosis. How is this treated? Treatment for this condition depends on whether you have symptoms. Your health care provider may also remove your skin tag if it is visible or if you do not like the way it looks. A skin tag can be removed by a health care provider with: A simple surgical procedure using scissors. A procedure that involves freezing your skin tag with a gas in liquid form (liquid nitrogen). A procedure that uses heat to destroy your skin tag (electrodessication). Follow these instructions at home: Watch for any changes in your skin tag. A normal skin tag does not require any other special care at home. Take over-the-counter and prescription medicines only as told by your health care provider. Keep all follow-up visits. Contact a health care provider if: You have a skin tag that: Becomes painful. Changes  color. Bleeds. Swells. Summary Skin tags are soft, extra growths of skin found in areas of frequent rubbing or friction. Skin tags usually do not cause symptoms. If symptoms occur, you may have pain, itching, or bleeding. Your health care provider may remove your skin tag if it causes symptoms or if you do not like the way it looks. This information is not intended to replace advice given to you by your health care provider. Make sure you discuss any questions you have with your health care provider. Document Revised: 06/02/2021 Document Reviewed: 06/02/2021 Elsevier Patient Education  [place].

## 2022-07-06 ENCOUNTER — Encounter: Payer: Self-pay | Admitting: Nurse Practitioner

## 2022-07-06 ENCOUNTER — Ambulatory Visit: Payer: Medicaid Other | Admitting: Nurse Practitioner

## 2022-07-06 VITALS — BP 117/82 | HR 58 | Temp 97.9°F | Ht 72.84 in | Wt 210.5 lb

## 2022-07-06 DIAGNOSIS — L918 Other hypertrophic disorders of the skin: Secondary | ICD-10-CM | POA: Diagnosis not present

## 2022-07-06 NOTE — Assessment & Plan Note (Signed)
Under right axilla x 1 and posterior neck. These are causing pain and getting irritated.  Removed x 2 today and sent to lab.

## 2022-07-06 NOTE — Progress Notes (Signed)
BP 117/82   Pulse (!) 58   Temp 97.9 F (36.6 C) (Oral)   Ht 6' 0.84" (1.85 m)   Wt 210 lb 8 oz (95.5 kg)   SpO2 99%   BMI 27.90 kg/m    Subjective:    Patient ID: Joel Mills, male    DOB: 1980/11/11, 42 y.o.   MRN: [id number]  HPI: Joel Mills is a 42 y.o. male  Chief Complaint  Patient presents with   Skin Tag    Patient would like to have removed.    SKIN TAGS Has skin tag under right axilla and posterior neck both of which are causing him discomfort with movement or items rubbing against area.  Both with mild erythema. Duration: months Location: as above Painful: yes Itching: yes Onset: gradual Context:  inflamed Associated signs and symptoms: inflamed History of skin cancer: no History of precancerous skin lesions: no Family history of skin cancer: no   Relevant past medical, surgical, family and social history reviewed and updated as indicated. Interim medical history since our last visit reviewed. Allergies and medications reviewed and updated.  Review of Systems  Constitutional:  Negative for activity change, diaphoresis, fatigue and fever.  Respiratory:  Negative for cough, chest tightness, shortness of breath and wheezing.   Cardiovascular:  Negative for chest pain, palpitations and leg swelling.  Gastrointestinal: Negative.   Skin:  Negative for color change.  Neurological: Negative.   Psychiatric/Behavioral: Negative.      Per HPI unless specifically indicated above     Objective:    BP 117/82   Pulse (!) 58   Temp 97.9 F (36.6 C) (Oral)   Ht 6' 0.84" (1.85 m)   Wt 210 lb 8 oz (95.5 kg)   SpO2 99%   BMI 27.90 kg/m   Wt Readings from Last 3 Encounters:  07/06/22 210 lb 8 oz (95.5 kg)  06/21/22 210 lb 4.8 oz (95.4 kg)  03/30/22 199 lb 1.6 oz (90.3 kg)    Physical Exam Vitals and nursing note reviewed.  Constitutional:      General: He is awake. He is not in acute distress.    Appearance: He is well-developed and well-groomed. He  is not ill-appearing or toxic-appearing.  HENT:     Head: Normocephalic and atraumatic.     Right Ear: Hearing and external ear normal.     Left Ear: Hearing and external ear normal.  Eyes:     General: Lids are normal.        Right eye: No discharge.        Left eye: No discharge.     Extraocular Movements: Extraocular movements intact.     Conjunctiva/sclera: Conjunctivae normal.     Pupils: Pupils are equal, round, and reactive to light.     Visual Fields: Right eye visual fields normal and left eye visual fields normal.  Cardiovascular:     Rate and Rhythm: Normal rate and regular rhythm.     Heart sounds: Normal heart sounds, S1 normal and S2 normal. No murmur heard.    No gallop.  Pulmonary:     Effort: Pulmonary effort is normal. No accessory muscle usage or respiratory distress.     Breath sounds: Normal breath sounds.  Abdominal:     General: Bowel sounds are normal.     Palpations: Abdomen is soft. There is no hepatomegaly or splenomegaly.     Tenderness: There is no abdominal tenderness.  Musculoskeletal:  General: Normal range of motion.     Cervical back: Normal range of motion and neck supple.     Right lower leg: No edema.     Left lower leg: No edema.  Skin:    General: Skin is warm and dry.     Capillary Refill: Capillary refill takes less than 2 seconds.     Findings: Lesion present.     Comments: One approx 1 cm raised skin tag under right axilla and one 1/2 cm raised skin tag to posterior neck.  Both with mild inflammation.  Neurological:     Mental Status: He is alert and oriented to person, place, and time.     Gait: Gait is intact.     Deep Tendon Reflexes: Reflexes are normal and symmetric.     Reflex Scores:      Brachioradialis reflexes are 2+ on the right side and 2+ on the left side.      Patellar reflexes are 2+ on the right side and 2+ on the left side. Psychiatric:        Attention and Perception: Attention normal.        Mood and  Affect: Mood normal.        Speech: Speech normal.        Behavior: Behavior normal. Behavior is cooperative.        Thought Content: Thought content normal.   Procedure: Skin tag removal Informed consent:  Discussed risks (permanent scarring, infection, pain, bleeding, bruising, redness, and recurrence of the lesion) and benefits of the procedure, as well as the alternatives.  He is aware that skin tags are benign lesions, and their removal is often not considered medically necessary.  Informed consent was obtained. Anesthesia: Lidocaine with epi 5 cc  The area was prepared and draped in a standard fashion. Snip removal was performed.   Antibiotic ointment applied, no bleeding to area. The patient tolerated procedure well. The patient was instructed on post-op care.   Number of lesions removed:  2  Results for orders placed or performed in visit on 06/21/22  CBC with Differential/Platelet  Result Value Ref Range   WBC 6.3 3.4 - 10.8 x10E3/uL   RBC 4.56 4.14 - 5.80 x10E6/uL   Hemoglobin 14.5 13.0 - 17.7 g/dL   Hematocrit 41.1 37.5 - 51.0 %   MCV 90 79 - 97 fL   MCH 31.8 26.6 - 33.0 pg   MCHC 35.3 31.5 - 35.7 g/dL   RDW 13.1 11.6 - 15.4 %   Platelets 165 150 - 450 x10E3/uL   Neutrophils 77 Not Estab. %   Lymphs 11 Not Estab. %   Monocytes 10 Not Estab. %   Eos 2 Not Estab. %   Basos 0 Not Estab. %   Neutrophils Absolute 4.8 1.4 - 7.0 x10E3/uL   Lymphocytes Absolute 0.7 0.7 - 3.1 x10E3/uL   Monocytes Absolute 0.6 0.1 - 0.9 x10E3/uL   EOS (ABSOLUTE) 0.1 0.0 - 0.4 x10E3/uL   Basophils Absolute 0.0 0.0 - 0.2 x10E3/uL   Immature Granulocytes 0 Not Estab. %   Immature Grans (Abs) 0.0 0.0 - 0.1 x10E3/uL  Comprehensive metabolic panel  Result Value Ref Range   Glucose 96 70 - 99 mg/dL   BUN 15 6 - 24 mg/dL   Creatinine, Ser 1.29 (H) 0.76 - 1.27 mg/dL   eGFR 72 >59 mL/min/1.73   BUN/Creatinine Ratio 12 9 - 20   Sodium 137 134 - 144 mmol/L   Potassium 3.9 3.5 -  5.2 mmol/L    Chloride 98 96 - 106 mmol/L   CO2 24 20 - 29 mmol/L   Calcium 9.6 8.7 - 10.2 mg/dL   Total Protein 6.9 6.0 - 8.5 g/dL   Albumin 4.8 4.1 - 5.1 g/dL   Globulin, Total 2.1 1.5 - 4.5 g/dL   Albumin/Globulin Ratio 2.3 (H) 1.2 - 2.2   Bilirubin Total 0.7 0.0 - 1.2 mg/dL   Alkaline Phosphatase 85 44 - 121 IU/L   AST 18 0 - 40 IU/L   ALT 19 0 - 44 IU/L  Lipid Panel w/o Chol/HDL Ratio  Result Value Ref Range   Cholesterol, Total 137 100 - 199 mg/dL   Triglycerides 103 0 - 149 mg/dL   HDL 27 (L) >39 mg/dL   VLDL Cholesterol Cal 19 5 - 40 mg/dL   LDL Chol Calc (NIH) 91 0 - 99 mg/dL  TSH  Result Value Ref Range   TSH 0.774 0.450 - 4.500 uIU/mL      Assessment & Plan:   Problem List Items Addressed This Visit       Musculoskeletal and Integument   Cutaneous skin tags - Primary    Under right axilla x 1 and posterior neck. These are causing pain and getting irritated.  Removed x 2 today and sent to lab.      Relevant Orders   Dermatology pathology     Follow up plan: Return if symptoms worsen or fail to improve.

## 2022-07-08 ENCOUNTER — Ambulatory Visit: Payer: Self-pay

## 2022-07-08 NOTE — Telephone Encounter (Signed)
Joel Mills calling from [facility] is calling to verify the source for recent pathology. Please advise   Returned Kaitlyn's call - Per chart source is:   Under right axilla x 1 and posterior neck. These are causing pain and getting irritated.  Removed x 2 today and sent to lab.

## 2022-07-08 NOTE — Telephone Encounter (Signed)
Returned call to [facility] with [facility], she stated that they resolved issue by looking at pathology req.

## 2022-07-12 ENCOUNTER — Ambulatory Visit: Payer: Medicaid Other | Admitting: Nurse Practitioner

## 2022-07-13 NOTE — Progress Notes (Signed)
Contacted via MyChart   Skin tags were benign skin tags:)

## 2022-07-25 ENCOUNTER — Telehealth: Payer: Self-pay | Admitting: Nurse Practitioner

## 2022-07-25 DIAGNOSIS — G8929 Other chronic pain: Secondary | ICD-10-CM

## 2022-07-25 NOTE — Telephone Encounter (Signed)
Copied from [place] [id number]. Topic: Referral - Request for Referral >> Jul 25, 2022  8:35 AM Joel Mills wrote: Has patient seen PCP for this complaint? Yes.   *If NO, is insurance requiring patient see PCP for this issue before PCP can refer them? Referral for which specialty: Chiropractor Preferred provider/office: [facility] Reason for referral: Back pain

## 2022-08-12 ENCOUNTER — Ambulatory Visit: Payer: Medicaid Other | Admitting: Nurse Practitioner

## 2022-08-12 DIAGNOSIS — R5383 Other fatigue: Secondary | ICD-10-CM

## 2022-08-13 ENCOUNTER — Other Ambulatory Visit: Payer: Self-pay | Admitting: Nurse Practitioner

## 2022-08-13 DIAGNOSIS — R7989 Other specified abnormal findings of blood chemistry: Secondary | ICD-10-CM | POA: Insufficient documentation

## 2022-08-13 NOTE — Progress Notes (Signed)
Good morning, please let Joel Mills know his labs have returned and his testosterone level is on low side, we need to recheck this in 4 weeks on outpatient labs again.  If this remains low on the second check we will get you into urology for further recommendations.  Any questions?  Please schedule lab only visit for 4 weeks first thing in morning.  Thank you. Keep being awesome!!  Thank you for allowing me to participate in your care.  I appreciate you. Kindest regards, Shatana Saxton

## 2022-08-15 ENCOUNTER — Telehealth: Payer: Self-pay

## 2022-08-15 NOTE — Telephone Encounter (Addendum)
Pt given lab results per notes of Jolene, NP on 08/15/22. Pt verbalized understanding. Pt scheduled Lab Visit 09/09/22 at 0820.  Pt also wanted to let Jolene, NP know that he got tested for ADHD and is now on low dose of Adderall. He thinks 5mg  but wasn't sure d/t meds not being with him.   Marjie Skiff, NP 08/13/2022  3:34 PM EDT     Good morning, please let Joel Mills know his labs have returned and his testosterone level is on low side, we need to recheck this in 4 weeks on outpatient labs again.  If this remains low on the second check we will get you into urology for further recommendations.  Any questions?  Please schedule lab only visit for 4 weeks first thing in morning.  Thank you. Keep being awesome!!  Thank you for allowing me to participate in your care.  I appreciate you. Kindest regards, Jolene

## 2022-08-17 LAB — TESTOSTERONE, FREE, TOTAL, SHBG
Sex Hormone Binding: 19.7 nmol/L (ref 16.5–55.9)
Testosterone, Free: 2.9 pg/mL — ABNORMAL LOW (ref 6.8–21.5)
Testosterone: 180 ng/dL — ABNORMAL LOW (ref 264–916)

## 2022-09-09 ENCOUNTER — Other Ambulatory Visit: Payer: Medicaid Other

## 2022-09-13 ENCOUNTER — Other Ambulatory Visit: Payer: Medicaid Other

## 2022-12-04 IMAGING — DX DG KNEE COMPLETE 4+V*L*
4 series · 4 of 4 positions shown · non-contrast
Comparison: 03/24/2020

CLINICAL DATA: Left knee pain for 2 weeks, no known injury, initial
encounter

EXAM:
LEFT KNEE - COMPLETE 4+ VIEW

[knee ap]
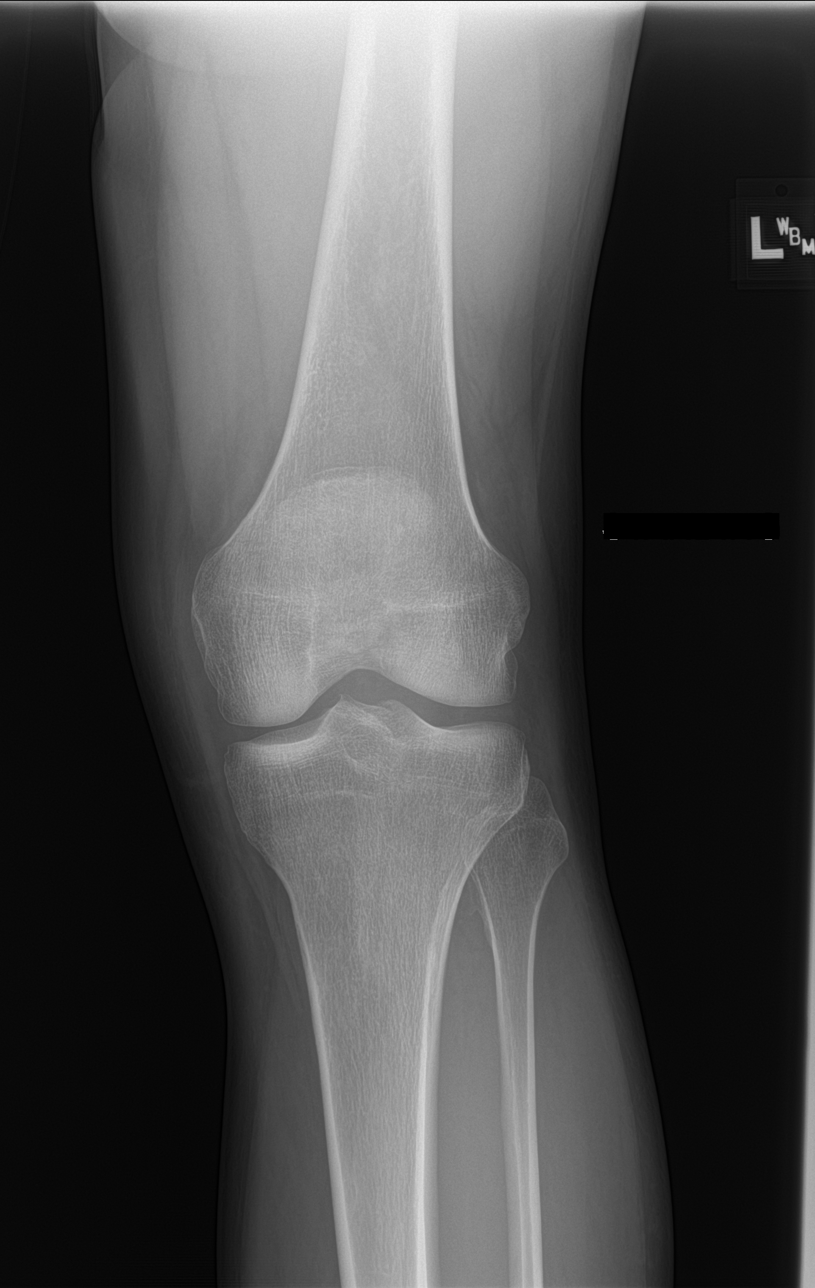

[knee tunnel]
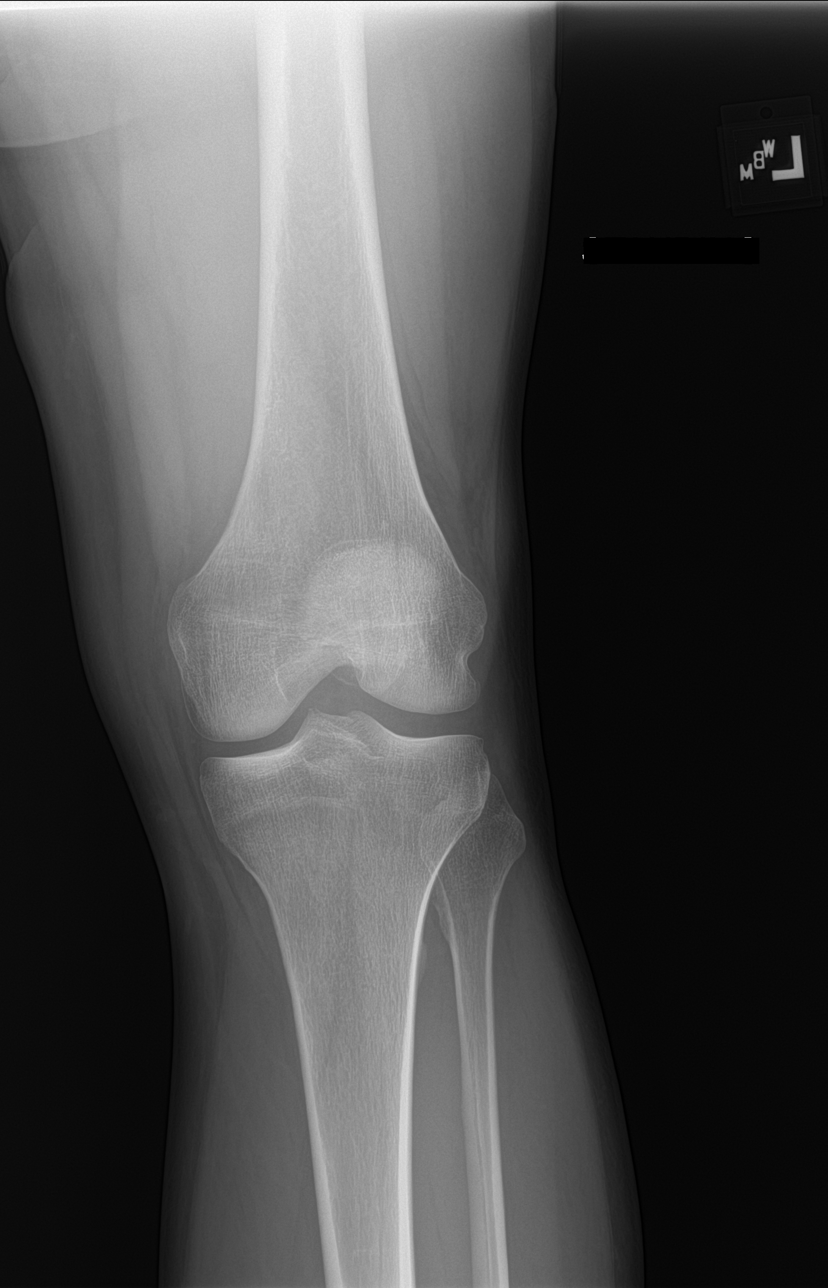

[knee lat]
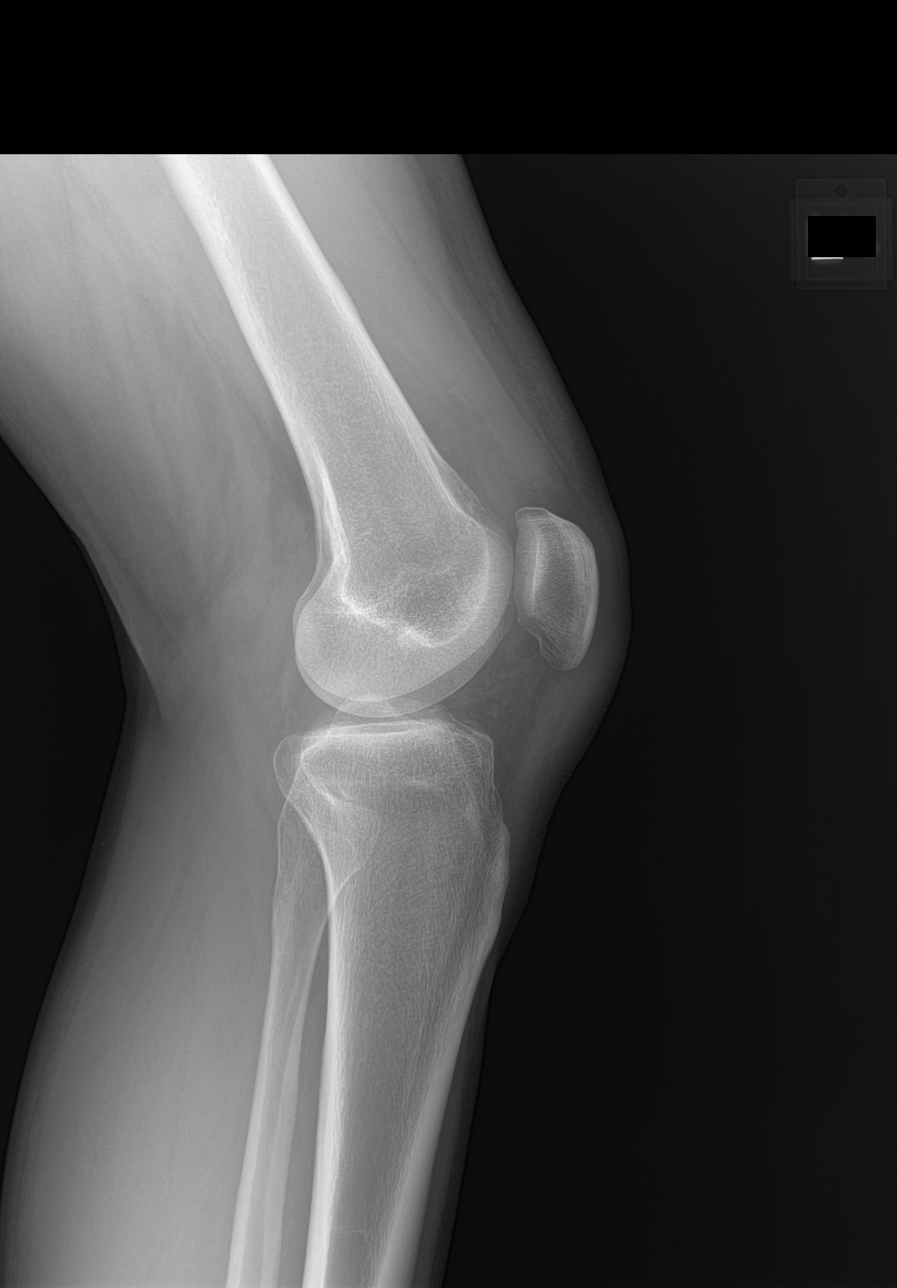

[patella skyline]
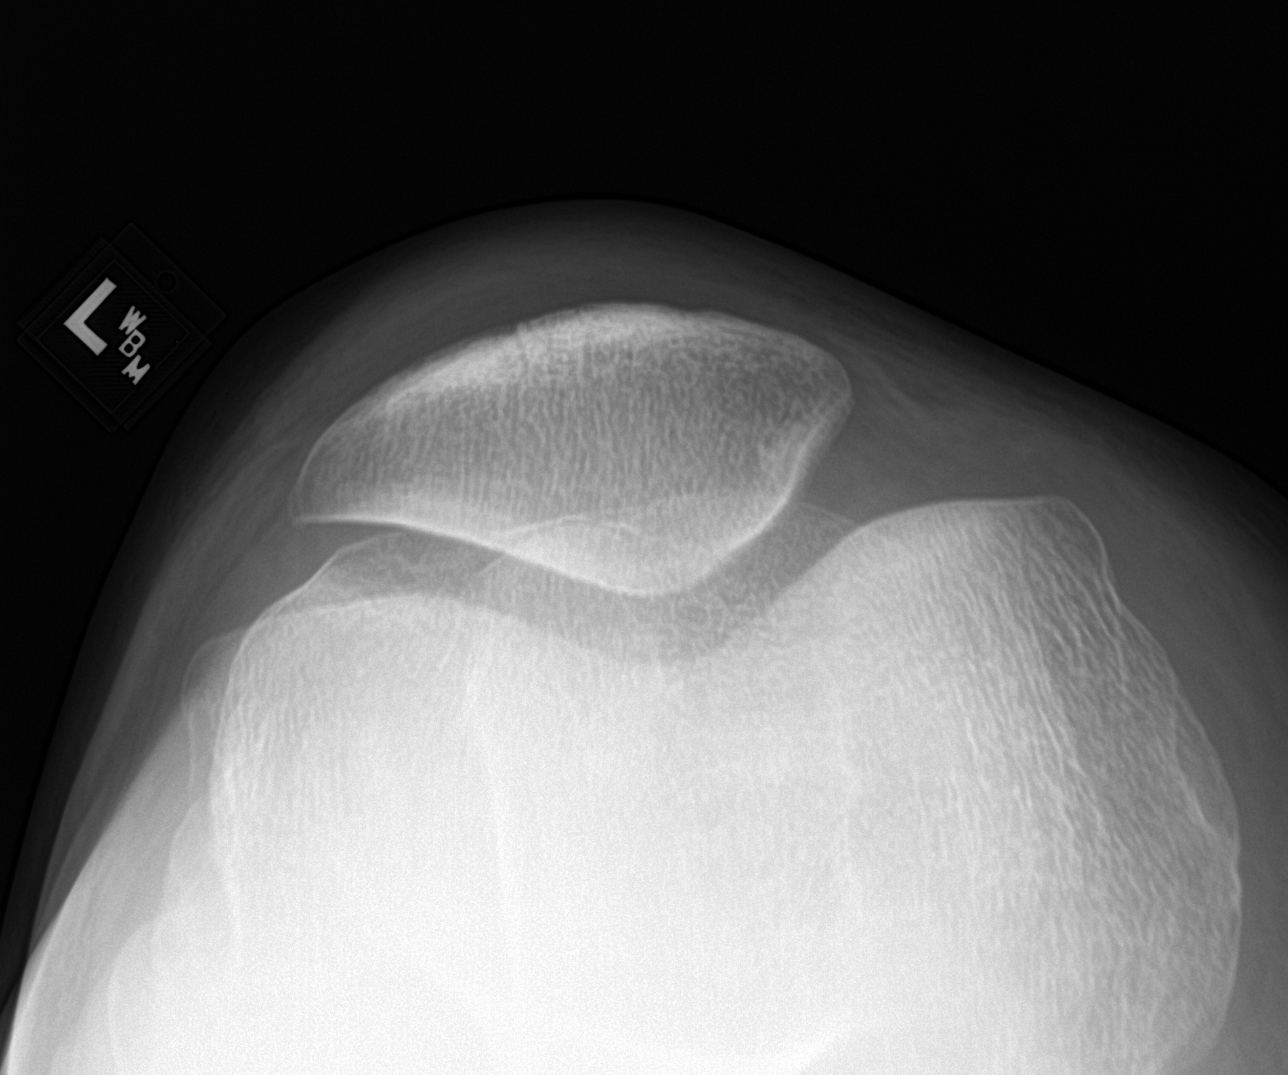

[4 of 4 positions shown; findings below may reference images not displayed]

FINDINGS: No evidence of fracture, dislocation, or joint effusion. No evidence
of arthropathy or other focal bone abnormality. Soft tissues are
unremarkable.
IMPRESSION: No acute abnormality noted.

## 2022-12-29 ENCOUNTER — Ambulatory Visit
Admission: EM | Admit: 2022-12-29 | Discharge: 2022-12-29 | Disposition: A | Discharge status: Home / Self Care | Payer: Medicaid Other | Source: Home / Self Care | Attending: Family Medicine | Admitting: Family Medicine

## 2022-12-29 ENCOUNTER — Ambulatory Visit (INDEPENDENT_AMBULATORY_CARE_PROVIDER_SITE_OTHER): Payer: Medicaid Other

## 2022-12-29 DIAGNOSIS — M79642 Pain in left hand: Secondary | ICD-10-CM

## 2022-12-29 DIAGNOSIS — H66001 Acute suppurative otitis media without spontaneous rupture of ear drum, right ear: Secondary | ICD-10-CM | POA: Diagnosis not present

## 2022-12-29 DIAGNOSIS — M25532 Pain in left wrist: Secondary | ICD-10-CM | POA: Diagnosis not present

## 2022-12-29 MED ORDER — AMOXICILLIN-POT CLAVULANATE 875-125 MG PO TABS: 1.0000 | ORAL_TABLET | Freq: Two times a day (BID) | ORAL | 0 refills | Status: DC

## 2022-12-29 NOTE — ED Triage Notes (Signed)
Pt c/o right ear irritation x1week. Fall onto left hand and wrist. X2-3weeks  Pt states that he hit his head onto his hand and is now having bruising along the 3rd, 4th, and 5th knuckle of the left hand. Pt states that he is losing grip strength in his hand and it has not been improving.

## 2022-12-29 NOTE — Discharge Instructions (Addendum)
Call Dr Donnald Garre a hand specialist for your hand wound.  She likely can see your at [facility] in [place]. Wear your wrist splint until you see her. You can take it off for water activities.   Take Tylenol as needed for pain. Volatren gel and Lidocaine patches can provide some topical pain relief.

## 2022-12-29 NOTE — ED Provider Notes (Signed)
[facility]    CSN: [id number] Arrival date & time: 12/29/22  1649      History   Chief Complaint Chief Complaint  Patient presents with   Hand Injury   Ear Pain    HPI  HPI Joel Mills is a 42 y.o. male.   Joel Mills presents for fell and hurt his left hand 2 weeks ago and its not improving. He was playing basketball and slipped on  slick spot during a spin out. His head hit his hand. Has some bruising on his hand. He didn't pass out. Most of the impact was to his chest and hand.   He has a lot of irritation in his right ear pain for about a week. A couple days ago he started scratching in his ear. Used a Qtip and it was very tender. When he rolls over to that side he needs to roll in the other direction. He works with a lot of dirt and dust.     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 Authorizing Provider  amoxicillin-clavulanate (AUGMENTIN) 875-125 MG tablet Take 1 tablet by mouth every 12 (twelve) hours. 12/29/22  Yes Erhardt Dada, DO  pantoprazole (PROTONIX) 40 MG tablet Take 1  tablet (40 mg total) by mouth 2 (two) times daily before a meal. 01/07/18 10/27/18  Milagros Loll, MD    Family History Family History  Problem Relation Age of Onset   Diabetes Mother    Hypertension Father    Depression Father    Cancer Sister    Depression Sister     Social History Social History   Tobacco Use   Smoking status: Former    Current packs/day: 0.00    Types: Cigarettes    Quit date: 11/30/2018    Years since quitting: 4.0   Smokeless tobacco: Never  Vaping Use   Vaping status: Never Used  Substance Use Topics   Alcohol use: Yes    Comment: socially   Drug use: Not Currently    Frequency: 3.0 times per week    Types: Marijuana     Allergies   Flexeril [cyclobenzaprine] and Nsaids   Review of Systems Review of Systems: :negative unless otherwise stated in HPI.      Physical Exam Triage Vital Signs ED Triage Vitals  Encounter Vitals Group     BP      Systolic BP Percentile      Diastolic BP Percentile      Pulse      Resp  Temp      Temp src      SpO2      Weight      Height      Head Circumference      Peak Flow      Pain Score      Pain Loc      Pain Education      Exclude from Growth Chart    No data found.  Updated Vital Signs BP (!) 123/90 (BP Location: Left Arm)   Pulse 75   Temp 98.3 F (36.8 C) (Oral)   Ht 6\' 2"  (1.88 m)   Wt 93 kg   SpO2 98%   BMI 26.32 kg/m   Visual Acuity Right Eye Distance:   Left Eye Distance:   Bilateral Distance:    Right Eye Near:   Left Eye Near:    Bilateral Near:     Physical Exam GEN: well appearing male in no acute distress  HENT: moist membranes, no TM joint tenderness, TM normal on the left but right had mild erythema, has bloody area on the lateral external auditory canal, no insect parts visible CVS: well perfused  RESP: speaking in full sentences without pause, no respiratory distress  MSK:  Left Hand/wrist: Inspection: No obvious deformity b/l. No swelling, erythema,  mild 2nd-4th metacarpal bruising  Palpation: scaphoid tenderness and 3rd metacarpal tenderness T ROM: Full ROM of the digits and wrist b/l. Fully able to extend and flex finger  Strength: 5/5 strength in the forearm, wrist and interosseus muscles b/l Neurovascular: NV intact b/l    UC Treatments / Results  Labs (all labs ordered are listed, but only abnormal results are displayed) Labs Reviewed - No data to display  EKG   Radiology DG Wrist Complete Left  Result Date: 12/29/2022 CLINICAL DATA:  Pain along the 4th metacarpal and scaphoid after falling 2 weeks ago. EXAM: LEFT WRIST - COMPLETE 3+ VIEW; LEFT HAND - COMPLETE 3+ VIEW COMPARISON:  Left wrist radiographs 09/23/2018. FINDINGS: The mineralization and alignment are normal. There is no evidence of acute fracture or dislocation. The scaphoid and 4th metacarpal appear unremarkable. No significant scapholunate diastasis identified on the current study. Stable bone island in the lunate. IMPRESSION: No evidence of acute fracture or dislocation in the left hand or wrist. Electronically Signed   By: Carey Bullocks M.D.   On: 12/29/2022 18:23   DG Hand Complete Left  Result Date: 12/29/2022 CLINICAL DATA:  Pain along the 4th metacarpal and scaphoid after falling 2 weeks ago. EXAM: LEFT WRIST - COMPLETE 3+ VIEW; LEFT HAND - COMPLETE 3+ VIEW COMPARISON:  Left wrist radiographs 09/23/2018. FINDINGS: The mineralization and alignment are normal. There is no evidence of acute fracture or dislocation. The scaphoid and 4th metacarpal appear unremarkable. No significant scapholunate diastasis identified on the current study. Stable bone island in the lunate. IMPRESSION: No evidence of acute fracture or dislocation in the left hand or wrist. Electronically Signed   By: Carey Bullocks M.D.   On: 12/29/2022 18:23     Procedures Procedures (including critical care time)  Medications Ordered in UC Medications - No data to display  Initial Impression  / Assessment and Plan / UC Course  I have reviewed the triage vital signs and the nursing notes.  Pertinent labs & imaging results that were available during my care of the patient were reviewed by me and considered in my medical decision making (see chart for details).      Pt  is a 42 y.o.  male with 2 weeks of left hand pain after a fall.   On exam, pt has tenderness at scaphoid concerning for fracture.   Obtained left hand and wrist plain films.  Personally interpreted by me were unremarkable for fracture or dislocation. Radiologist report reviewed and additionally notes  no acute findings. Given wrist brace prior to discharge.   Patient to gradually return to normal activities, as tolerated and continue ordinary activities within the limits permitted by pain. He avoids oral NSAIDS.  Use Voltaren gel and Tylenol PRN. Patient to follow up with hand specialist for his hand pain.  He doesn't have evidence of cerumen impaction or externa.  He does have mild erythema of right TM and blood area that was TTP.  Treat for possible developing acute otitis media with antibiotics as below.       Return and ED precautions given. Understanding voiced. Discussed MDM, treatment plan and plan for follow-up with patient/parent who agrees with plan.   Final Clinical Impressions(s) / UC Diagnoses   Final diagnoses:  Wrist pain, left  Non-recurrent acute suppurative otitis media of right ear without spontaneous rupture of tympanic membrane     Discharge Instructions      Call Dr Donnald Garre a hand specialist for your hand wound.  She likely can see your at [facility] in [place]. Wear your wrist splint until you see her. You can take it off for water activities.   Take Tylenol as needed for pain. Volatren gel and Lidocaine patches can provide some topical pain relief.         ED Prescriptions     Medication Sig Dispense Auth. Provider   amoxicillin-clavulanate (AUGMENTIN) 875-125 MG tablet  Take 1 tablet by mouth every 12 (twelve) hours. 14 tablet Crimson Dubberly, Seward Meth, DO      PDMP not reviewed this encounter.   Katha Cabal, DO 01/01/23 1157

## 2023-01-02 ENCOUNTER — Ambulatory Visit: Payer: Medicaid Other

## 2023-01-03 ENCOUNTER — Ambulatory Visit: Payer: Medicaid Other | Admitting: Physician Assistant

## 2023-01-03 ENCOUNTER — Encounter: Payer: Self-pay | Admitting: Physician Assistant

## 2023-01-03 DIAGNOSIS — M25532 Pain in left wrist: Secondary | ICD-10-CM

## 2023-01-03 NOTE — Progress Notes (Signed)
Office Visit Note   Patient: Joel Mills           Date of Birth: 1980-07-11           MRN: [id number] Visit Date: 01/03/2023              Requested by: Marjie Skiff, NP [address] [place],  [place] [address] PCP: Marjie Skiff, NP  Chief Complaint  Patient presents with   Left Wrist - Pain      HPI: Joel Mills is a pleasant 42 year old right-hand-dominant gentleman with a 2-1/2-week history of left base of thumb wrist pain.  Has had some fractures as a child but no recent injuries until 2 weeks ago when he was playing basketball and slipped on some wet flooring and came down onto his left outstretched hand.  Denies any ecchymosis but did have some swelling at the time.  He treated this conservatively for a couple weeks but was having considerable pain and felt like his hand just "looked different ".  Was seen and had x-rays done last week.  All of the x-rays were read as negative concerns were for scaphoid injury.  He was given a thumb spica splint.  He comes in today for follow-up really has not had any improvement.  Has difficulty even just gripping or grip strength.  Pain shoots out to his thumb but is focused in the base thumb wrist on the palmar side  Assessment & Plan: Visit Diagnoses:  1. Pain in left wrist     Plan: I do have concerns for scaphoid injury he has not improved.  He does have difficulty even opposing his fingers.  He is exquisitely tender to palpation over the scaphoid.  He is neurovascularly intact he does have some atrophy thenar but unsure as this is not his dominant hand if this is baseline.  Will follow-up with the results.  Will review with our hand specialist.  If significant injury would refer him to him  Follow-Up Instructions: Return in about 1 week (around 01/10/2023).   Ortho Exam  Patient is alert, oriented, no adenopathy, well-dressed, normal affect, normal respiratory effort. Examination he has a strong radial pulse sensation is intact he does  have some atrophy over the thenar eminence.  His exquisitely tender to palpation over the scaphoid.  Has poor opposition of fingers because of pain.  Cannot grip.  No specific tenderness in the distal radius  Imaging: No results found. No images are attached to the encounter.  Labs: Lab Results  Component Value Date   HGBA1C 4.7 (L) 03/30/2022     Lab Results  Component Value Date   ALBUMIN 4.8 06/21/2022   ALBUMIN 5.1 03/30/2022   ALBUMIN 4.8 10/24/2019    No results found for: "MG" No results found for: "VD25OH"  No results found for: "PREALBUMIN"    Latest Ref Rng & Units 06/21/2022    4:20 PM 03/30/2022    2:42 PM 10/24/2019    8:51 AM  CBC EXTENDED  WBC 3.4 - 10.8 x10E3/uL 6.3  7.0  5.9   RBC 4.14 - 5.80 x10E6/uL 4.56  5.09  4.96   Hemoglobin 13.0 - 17.7 g/dL 62.1  30.8  65.7   HCT 37.5 - 51.0 % 41.1  46.4  47.3   Platelets 150 - 450 x10E3/uL 165  199  172   NEUT# 1.4 - 7.0 x10E3/uL 4.8  3.8  3.2   Lymph# 0.7 - 3.1 x10E3/uL 0.7  2.3  1.7  There is no height or weight on file to calculate BMI.  Orders:  Orders Placed This Encounter  Procedures   MR Wrist Left w/o contrast   No orders of the defined types were placed in this encounter.    Procedures: No procedures performed  Clinical Data: No additional findings.  ROS:  All other systems negative, except as noted in the HPI. Review of Systems  Objective: Vital Signs: There were no vitals taken for this visit.  Specialty Comments:  No specialty comments available.  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   Anxiety and depression 09/27/2012   Hernia 09/26/2012   Past  Medical History:  Diagnosis Date   Anemia    43 years old   Asthma    Depression     Family History  Problem Relation Age of Onset   Diabetes Mother    Hypertension Father    Depression Father    Cancer Sister    Depression Sister     Past Surgical History:  Procedure Laterality Date   ADENOIDECTOMY     ESOPHAGOGASTRODUODENOSCOPY (EGD) WITH PROPOFOL N/A 01/07/2018   Procedure: ESOPHAGOGASTRODUODENOSCOPY (EGD) WITH PROPOFOL;  Surgeon: Toledo, Boykin Nearing, MD;  Location: [facility] ENDOSCOPY;  Service: Gastroenterology;  Laterality: N/A;   VASECTOMY     Social History   Occupational History   Occupation: Corporate investment banker  Tobacco Use   Smoking status: Former    Current packs/day: 0.00    Types: Cigarettes    Quit date: 11/30/2018    Years since quitting: 4.0   Smokeless tobacco: Never  Vaping Use   Vaping status: Never Used  Substance and Sexual Activity   Alcohol use: Yes    Comment: socially   Drug use: Not Currently    Frequency: 3.0 times per week    Types: Marijuana   Sexual activity: Yes

## 2023-01-07 ENCOUNTER — Ambulatory Visit
Admission: RE | Admit: 2023-01-07 | Discharge: 2023-01-07 | Disposition: A | Discharge status: Home / Self Care | Payer: Medicaid Other | Source: Ambulatory Visit | Attending: Physician Assistant | Admitting: Physician Assistant

## 2023-01-07 DIAGNOSIS — M25532 Pain in left wrist: Secondary | ICD-10-CM

## 2023-01-09 ENCOUNTER — Other Ambulatory Visit: Payer: Self-pay

## 2023-01-09 ENCOUNTER — Other Ambulatory Visit: Payer: Self-pay | Admitting: Physician Assistant

## 2023-01-09 DIAGNOSIS — M25532 Pain in left wrist: Secondary | ICD-10-CM

## 2023-01-10 ENCOUNTER — Ambulatory Visit (INDEPENDENT_AMBULATORY_CARE_PROVIDER_SITE_OTHER): Payer: Medicaid Other | Admitting: Orthopedic Surgery

## 2023-01-10 DIAGNOSIS — S62175A Nondisplaced fracture of trapezium [larger multangular], left wrist, initial encounter for closed fracture: Secondary | ICD-10-CM | POA: Diagnosis not present

## 2023-01-10 NOTE — Progress Notes (Signed)
Joel Mills - 42 y.o. male MRN [id number]  Date of birth: April 22, 1981  Office Visit Note: Visit Date: 01/10/2023 PCP: Marjie Skiff, NP Referred by: Persons, [place], PA  Subjective: No chief complaint on file.  HPI: Joel Mills is a pleasant 42 y.o. male who presents today for evaluation of his left wrist injury sustained approximately 3 weeks prior.  Injury mechanism described as fall onto the outstretched left wrist while playing basketball.  He was seen in the outpatient setting and underwent x-ray and recent MRI of the left wrist which demonstrated nondisplaced trapezial fracture.  Has been placed into a thumb spica brace which he is utilizing daily, states that the pain is improving.  Pertinent ROS were reviewed with the patient and found to be negative unless otherwise specified above in HPI.   Visit Reason: left wrist Hand dominance: right Occupation: Holiday representative Diabetic: No Heart/Lung History: none Blood Thinners:  none  Prior Testing/EMG:Xray 12/29/22 MRI 01/07/23 Injections (Date): none Treatments: brace, tylenol  Prior Surgery: none   Assessment & Plan: Visit Diagnoses: No diagnosis found.  Plan: Extensive discussion with the patient today regarding his left wrist injury.  I reviewed the results of his recent MRI in detail with him today.  There is what appears to be an acute to subacute nondisplaced fracture involving the trapezium which consistent with his clinical examination.  He also does appear to have a volar radial cyst at the wrist level which may be incidental, given that he states that this was present prior to the injury.  At this juncture, he should continue utilizing the left thumb spica brace for an additional 3 weeks to allow for appropriate healing.  I will plan on seeing him back at that juncture for a repeat clinical and radiographic check.  For the time being, he should remain nonweightbearing with the left hand and utilize a thumb spica brace  at all times except for hygiene.  Follow-up: No follow-ups on file.   Meds & Orders: No orders of the defined types were placed in this encounter.  No orders of the defined types were placed in this encounter.    Procedures: No procedures performed      Clinical History: No specialty comments available.  He reports that he quit smoking about 4 years ago. His smoking use included cigarettes. He has never used smokeless tobacco.  Recent Labs    03/30/22 1442  HGBA1C 4.7*    Objective:   Vital Signs: There were no vitals taken for this visit.  Physical Exam  Gen: Well-appearing, in no acute distress; non-toxic CV: Regular Rate. Well-perfused. Warm.  Resp: Breathing unlabored on room air; no wheezing. Psych: Fluid speech in conversation; appropriate affect; normal thought process  Ortho Exam Left hand: - Tenderness elicited over the trapezial region just proximal to the left thumb, more prominent volarly consistent with MRI findings - Able to perform range of motion of the thumb with abduction, flexion and extension without restriction - Sensation intact in all distributions to light touch, hand is warm well-perfused - Palpable mass over the volar radial wrist region, proximal to the wrist crease, measures approximate 1 cm x 1 cm, mobile, minimally tender - Full wrist range of motion without restriction, flexion/extension 65/55, able to form composite fist without significant restriction  Imaging: No results found.  Past Medical/Family/Surgical/Social History: Medications & Allergies reviewed per EMR, new medications updated. Patient Active Problem List   Diagnosis Date Noted   Low testosterone level in  male 08/13/2022   Concentration deficit 06/21/2022   Fatigue 06/21/2022   Chronic left hip pain 06/21/2022   Cutaneous skin tags 06/21/2022   Tinnitus 03/30/2022   History of gastric ulcer 10/27/2019   Chronic bilateral low back pain with bilateral sciatica 09/24/2019    Anxiety and depression 09/27/2012   Hernia 09/26/2012   Past Medical History:  Diagnosis Date   Anemia    42 years old   Asthma    Depression    Family History  Problem Relation Age of Onset   Diabetes Mother    Hypertension Father    Depression Father    Cancer Sister    Depression Sister    Past Surgical History:  Procedure Laterality Date   ADENOIDECTOMY     ESOPHAGOGASTRODUODENOSCOPY (EGD) WITH PROPOFOL N/A 01/07/2018   Procedure: ESOPHAGOGASTRODUODENOSCOPY (EGD) WITH PROPOFOL;  Surgeon: Toledo, Boykin Nearing, MD;  Location: [facility] ENDOSCOPY;  Service: Gastroenterology;  Laterality: N/A;   VASECTOMY     Social History   Occupational History   Occupation: Corporate investment banker  Tobacco Use   Smoking status: Former    Current packs/day: 0.00    Types: Cigarettes    Quit date: 11/30/2018    Years since quitting: 4.1   Smokeless tobacco: Never  Vaping Use   Vaping status: Never Used  Substance and Sexual Activity   Alcohol use: Yes    Comment: socially   Drug use: Not Currently    Frequency: 3.0 times per week    Types: Marijuana   Sexual activity: Yes    Joel Mills, M.D. [facility] 11:28 AM

## 2023-02-06 ENCOUNTER — Ambulatory Visit: Payer: Medicaid Other | Admitting: Orthopedic Surgery

## 2023-02-06 ENCOUNTER — Other Ambulatory Visit: Payer: Self-pay

## 2023-02-06 DIAGNOSIS — S62175A Nondisplaced fracture of trapezium [larger multangular], left wrist, initial encounter for closed fracture: Secondary | ICD-10-CM

## 2023-02-06 NOTE — Progress Notes (Signed)
Joel Mills - 42 y.o. male MRN [id number]  Date of birth: Dec 18, 1980  Office Visit Note: Visit Date: 02/06/2023 PCP: Marjie Skiff, NP Referred by: Marjie Skiff, NP  Subjective: No chief complaint on file.  HPI: Joel Mills is a pleasant 42 y.o. male who presents today for follow-up of his left wrist injury sustained approximately 6 weeks prior.  He had undergone MRI of the left wrist which demonstrated nondisplaced trapezial fracture.  Had been placed into a thumb spica brace which he is utilizing daily, states that the pain is significantly improved.  Pertinent ROS were reviewed with the patient and found to be negative unless otherwise specified above in HPI.    Assessment & Plan: Visit Diagnoses:  1. Closed nondisplaced fracture of trapezium of left wrist, initial encounter     Plan: Extensive discussion with the patient today regarding his left wrist injury.  X-rays obtained today show stable appearance of the radiocarpal and midcarpal articulations, the subacute trapezial fracture is not well-visualized on x-ray today which is a positive indicator.  Clinically, he is demonstrating appropriate healing.  At this juncture, I would recommend that he utilize a thumb spica brace for another 2 weeks during heavy activities, can come out of the brace for range of motion activities with slow progression to strengthening.  He is comfortable doing these exercises on his own.  I will plan on seeing him back in approximately 4 weeks time for a recheck or as needed.  Follow-up: No follow-ups on file.   Meds & Orders: No orders of the defined types were placed in this encounter.   Orders Placed This Encounter  Procedures   XR Wrist Complete Left     Procedures: No procedures performed      Clinical History: No specialty comments available.  He reports that he quit smoking about 4 years ago. His smoking use included cigarettes. He has never used smokeless tobacco.  Recent  Labs    03/30/22 1442  HGBA1C 4.7*    Objective:   Vital Signs: There were no vitals taken for this visit.  Physical Exam  Gen: Well-appearing, in no acute distress; non-toxic CV: Regular Rate. Well-perfused. Warm.  Resp: Breathing unlabored on room air; no wheezing. Psych: Fluid speech in conversation; appropriate affect; normal thought process  Ortho Exam Left hand: - Tenderness elicited over the trapezial region just proximal to the left thumb, more prominent volarly consistent with MRI findings - Able to perform range of motion of the thumb with abduction, flexion and extension without restriction - Sensation intact in all distributions to light touch, hand is warm well-perfused - Palpable mass over the volar radial wrist region, proximal to the wrist crease, measures approximate 1 cm x 1 cm, mobile, minimally tender - Full wrist range of motion without restriction, flexion/extension 65/55, able to form composite fist without significant restriction  Imaging: No results found.  Past Medical/Family/Surgical/Social History: Medications & Allergies reviewed per EMR, new medications updated. Patient Active Problem List   Diagnosis Date Noted   Low testosterone level in male 08/13/2022   Concentration deficit 06/21/2022   Fatigue 06/21/2022   Chronic left hip pain 06/21/2022   Cutaneous skin tags 06/21/2022   Tinnitus 03/30/2022   History of gastric ulcer 10/27/2019   Chronic bilateral low back pain with bilateral sciatica 09/24/2019   Anxiety and depression 09/27/2012   Hernia 09/26/2012   Past Medical History:  Diagnosis Date   Anemia    42 years old  Asthma    Depression    Family History  Problem Relation Age of Onset   Diabetes Mother    Hypertension Father    Depression Father    Cancer Sister    Depression Sister    Past Surgical History:  Procedure Laterality Date   ADENOIDECTOMY     ESOPHAGOGASTRODUODENOSCOPY (EGD) WITH PROPOFOL N/A 01/07/2018    Procedure: ESOPHAGOGASTRODUODENOSCOPY (EGD) WITH PROPOFOL;  Surgeon: Toledo, Boykin Nearing, MD;  Location: [facility] ENDOSCOPY;  Service: Gastroenterology;  Laterality: N/A;   VASECTOMY     Social History   Occupational History   Occupation: Corporate investment banker  Tobacco Use   Smoking status: Former    Current packs/day: 0.00    Types: Cigarettes    Quit date: 11/30/2018    Years since quitting: 4.1   Smokeless tobacco: Never  Vaping Use   Vaping status: Never Used  Substance and Sexual Activity   Alcohol use: Yes    Comment: socially   Drug use: Not Currently    Frequency: 3.0 times per week    Types: Marijuana   Sexual activity: Yes    Foster Sonnier Trevor Mace, M.D. [facility] 9:09 AM

## 2023-02-17 ENCOUNTER — Telehealth: Payer: Medicaid Other | Admitting: Family Medicine

## 2023-02-17 DIAGNOSIS — J4540 Moderate persistent asthma, uncomplicated: Secondary | ICD-10-CM | POA: Diagnosis not present

## 2023-02-17 MED ORDER — PREDNISONE 20 MG PO TABS: 20.0000 mg | ORAL_TABLET | Freq: Two times a day (BID) | ORAL | 0 refills | Status: AC

## 2023-02-17 MED ORDER — AZITHROMYCIN 250 MG PO TABS: ORAL_TABLET | ORAL | 0 refills | Status: AC

## 2023-02-17 MED ORDER — PROMETHAZINE-DM 6.25-15 MG/5ML PO SYRP: 5.0000 mL | ORAL_SOLUTION | Freq: Four times a day (QID) | ORAL | 0 refills | Status: DC | PRN

## 2023-02-17 NOTE — Progress Notes (Signed)
Virtual Visit Consent   Joel Mills, you are scheduled for a virtual visit with a [facility] provider today. Just as with appointments in the office, your consent must be obtained to participate. Your consent will be active for this visit and any virtual visit you may have with one of our providers in the next 365 days. If you have a MyChart account, a copy of this consent can be sent to you electronically.  As this is a virtual visit, video technology does not allow for your provider to perform a traditional examination. This may limit your provider's ability to fully assess your condition. If your provider identifies any concerns that need to be evaluated in person or the need to arrange testing (such as labs, EKG, etc.), we will make arrangements to do so. Although advances in technology are sophisticated, we cannot ensure that it will always work on either your end or our end. If the connection with a video visit is poor, the visit may have to be switched to a telephone visit. With either a video or telephone visit, we are not always able to ensure that we have a secure connection.  By engaging in this virtual visit, you consent to the provision of healthcare and authorize for your insurance to be billed (if applicable) for the services provided during this visit. Depending on your insurance coverage, you may receive a charge related to this service.  I need to obtain your verbal consent now. Are you willing to proceed with your visit today? Joel Mills has provided verbal consent on 02/17/2023 for a virtual visit (video or telephone). [name], FNP  Date: 02/17/2023 7:21 PM  Virtual Visit via Video Note   I, [name], connected with  Joel Mills  ([id number], 07-22-1980) on 02/17/23 at  7:15 PM EDT by a video-enabled telemedicine application and verified that I am speaking with the correct person using two identifiers.  Location: Patient: Virtual Visit Location Patient:  Home Provider: Virtual Visit Location Provider: Home Office   I discussed the limitations of evaluation and management by telemedicine and the availability of in person appointments. The patient expressed understanding and agreed to proceed.    History of Present Illness: Joel Mills is a 42 y.o. who identifies as a male who was assigned male at birth, and is being seen today for cough, wheezing, prod cough with brown mucus, fever earlier this week, sx for a week, no in home covid testing. Joel Mills  HPI: HPI  Problems:  Patient Active Problem List   Diagnosis Date Noted   Low testosterone level in male 08/13/2022   Concentration deficit 06/21/2022   Fatigue 06/21/2022   Chronic left hip pain 06/21/2022   Cutaneous skin tags 06/21/2022   Tinnitus 03/30/2022   History of gastric ulcer 10/27/2019   Chronic bilateral low back pain with bilateral sciatica 09/24/2019   Anxiety and depression 09/27/2012   Hernia 09/26/2012    Allergies:  Allergies  Allergen Reactions   Flexeril [Cyclobenzaprine] Other (See Comments)    Mood swings per patient   Nsaids Hives   Medications:  Current Outpatient Medications:    azithromycin (ZITHROMAX) 250 MG tablet, Take 2 tablets on day 1, then 1 tablet daily on days 2 through 5, Disp: 6 tablet, Rfl: 0   predniSONE (DELTASONE) 20 MG tablet, Take 1 tablet (20 mg total) by mouth 2 (two) times daily with a meal for 5 days., Disp: 10 tablet, Rfl: 0   promethazine-dextromethorphan (PROMETHAZINE-DM) 6.25-15 MG/5ML syrup, Take  5 mLs by mouth 4 (four) times daily as needed for cough., Disp: 118 mL, Rfl: 0   amoxicillin-clavulanate (AUGMENTIN) 875-125 MG tablet, Take 1 tablet by mouth every 12 (twelve) hours., Disp: 14 tablet, Rfl: 0  Observations/Objective: Patient is well-developed, well-nourished in no acute distress.  Resting comfortably  at home.  Head is normocephalic, atraumatic.  No labored breathing.  Speech is clear and coherent with logical content.   Patient is alert and oriented at baseline.    Assessment and Plan: 1. Moderate persistent asthmatic bronchitis without complication  Increase fluids, humidifier at night, tylenol or ibuprofen as directed, UC if sx persist or worsen.   Follow Up Instructions: I discussed the assessment and treatment plan with the patient. The patient was provided an opportunity to ask questions and all were answered. The patient agreed with the plan and demonstrated an understanding of the instructions.  A copy of instructions were sent to the patient via MyChart unless otherwise noted below.     The patient was advised to call back or seek an in-person evaluation if the symptoms worsen or if the condition fails to improve as anticipated.    [name], FNP

## 2023-02-17 NOTE — Patient Instructions (Signed)

## 2023-03-13 ENCOUNTER — Ambulatory Visit: Payer: Medicaid Other | Admitting: Orthopedic Surgery

## 2023-06-09 ENCOUNTER — Encounter: Payer: Self-pay | Admitting: Emergency Medicine

## 2023-06-09 ENCOUNTER — Ambulatory Visit: Payer: Self-pay

## 2023-06-09 ENCOUNTER — Ambulatory Visit
Admission: EM | Admit: 2023-06-09 | Discharge: 2023-06-09 | Disposition: A | Discharge status: Home / Self Care | Payer: Medicaid Other | Attending: Emergency Medicine | Admitting: Emergency Medicine

## 2023-06-09 DIAGNOSIS — S0502XA Injury of conjunctiva and corneal abrasion without foreign body, left eye, initial encounter: Secondary | ICD-10-CM | POA: Diagnosis not present

## 2023-06-09 DIAGNOSIS — G4730 Sleep apnea, unspecified: Secondary | ICD-10-CM

## 2023-06-09 MED ORDER — TETRACAINE HCL 0.5 % OP SOLN: 2.0000 [drp] | Freq: Once | OPHTHALMIC | Status: DC

## 2023-06-09 MED ORDER — FLUORESCEIN SODIUM 1 MG OP STRP: 1.0000 | ORAL_STRIP | Freq: Once | OPHTHALMIC | Status: DC

## 2023-06-09 MED ORDER — ERYTHROMYCIN 5 MG/GM OP OINT: TOPICAL_OINTMENT | OPHTHALMIC | 0 refills | Status: DC

## 2023-06-09 NOTE — Discharge Instructions (Addendum)
 Use eye ointment as, wash hands before and after use If you have change in your vision or worsening symptoms go to the emergency room for further evaluation Recommend sleep study-set up with PCP or pulmonologist Avoid smoking, avoid spicy greasy fried foods, make sure he set up 3 hours after eating before you go to bed

## 2023-06-09 NOTE — Telephone Encounter (Signed)
    Chief Complaint: Report awakened from sleep last night. Had stopped breathing and could not take a breath. States his wife has noticed irregular breathing while sleeping. Appointment made for next week, asking to be worked in sooner. Symptoms: Above Frequency: Last night Pertinent Negatives: Patient denies any breathing problems now. Disposition: [] ED /[] Urgent Care (no appt availability in office) / [x] Appointment(In office/virtual)/ []  [facility]/ [] Home Care/ [] Refused Recommended Disposition /[] [facility]/ []  Follow-up with PCP Additional Notes: Please advise pt.  Reason for Disposition  [1] MILD longstanding difficulty breathing AND [2]  SAME as normal  Answer Assessment - Initial Assessment Questions 1. RESPIRATORY STATUS: Describe your breathing? (e.g., wheezing, shortness of breath, unable to speak, severe coughing)      Stopped breathing while sleeping 2. ONSET: When did this breathing problem begin?      Last night 3. PATTERN Does the difficult breathing come and go, or has it been constant since it started?      Happened x 1 4. SEVERITY: How bad is your breathing? (e.g., mild, moderate, severe)    - MILD: No SOB at rest, mild SOB with walking, speaks normally in sentences, can lie down, no retractions, pulse < 100.    - MODERATE: SOB at rest, SOB with minimal exertion and prefers to sit, cannot lie down flat, speaks in phrases, mild retractions, audible wheezing, pulse 100-120.    - SEVERE: Very SOB at rest, speaks in single words, struggling to breathe, sitting hunched forward, retractions, pulse > 120      None now 5. RECURRENT SYMPTOM: Have you had difficulty breathing before? If Yes, ask: When was the last time? and What happened that time?       X 1 6. CARDIAC HISTORY: Do you have any history of heart disease? (e.g., heart attack, angina, bypass surgery, angioplasty)      No 7. LUNG HISTORY: Do you have any history of lung  disease?  (e.g., pulmonary embolus, asthma, emphysema)     No 8. CAUSE: What do you think is causing the breathing problem?      Unsure 9. OTHER SYMPTOMS: Do you have any other symptoms? (e.g., dizziness, runny nose, cough, chest pain, fever)     No 10. O2 SATURATION MONITOR:  Do you use an oxygen saturation monitor (pulse oximeter) at home? If Yes, ask: What is your reading (oxygen level) today? What is your usual oxygen saturation reading? (e.g., 95%)       N/a 11. PREGNANCY: Is there any chance you are pregnant? When was your last menstrual period?       N/a 12. TRAVEL: Have you traveled out of the country in the last month? (e.g., travel history, exposures)       No  Protocols used: Breathing Difficulty-A-AH

## 2023-06-09 NOTE — Telephone Encounter (Signed)
 Spoke to patient. He was feeling better. Stated he would go to urgent care if needed before his appt on 06/13/2023 at 9:20 AM

## 2023-06-09 NOTE — ED Provider Notes (Signed)
 [facility]    CSN: [id number] Arrival date & time: 06/09/23  1806      History   Chief Complaint Chief Complaint  Patient presents with   Eye Injury    left    HPI Joel Mills is a 43 y.o. male.   43 year old male patient, Joel Mills, presents to urgent care for evaluation of left eye injury.  Patient states he was hitting on a rock and a piece flew up under his safety glasses and hit him in left eye, no visual changes, flushed eye out well at work, has had pain since.  Last tetanus was 4 years prior per pt report. Patient states he woke up last night and could not catch his breath, like a stop breathing, hx of snoring  Pt states he used to smoke, distant hx of asthma  The history is provided by the patient. No language interpreter was used.    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 Authorizing Provider  erythromycin  ophthalmic ointment Place a 1/2 inch ribbon of ointment into the left lower eyelid every 6 hours x 5 days  06/09/23  Yes Darlisa Spruiell, NP  amoxicillin -clavulanate (AUGMENTIN ) 875-125 MG tablet Take 1 tablet by mouth every 12 (twelve) hours. 12/29/22   Brimage, Vondra, DO  promethazine -dextromethorphan (PROMETHAZINE -DM) 6.25-15 MG/5ML syrup Take 5 mLs by mouth 4 (four) times daily as needed for cough. 02/17/23 06/17/23  Blair, Diane W, FNP  pantoprazole  (PROTONIX ) 40 MG tablet Take 1 tablet (40 mg total) by mouth 2 (two) times daily before a meal. 01/07/18 10/27/18  Yisroel Sleight, MD    Family History Family History  Problem Relation Age of Onset   Diabetes Mother    Hypertension Father    Depression Father    Cancer Sister    Depression Sister     Social History Social History   Tobacco Use   Smoking status: Former    Current packs/day: 0.00    Types: Cigarettes    Quit date: 11/30/2018    Years since quitting: 4.5   Smokeless tobacco: Never  Vaping Use   Vaping status: Never Used  Substance Use Topics   Alcohol use: Yes    Comment: socially   Drug use: Not Currently    Frequency: 3.0 times per week    Types: Marijuana     Allergies   Flexeril  [cyclobenzaprine ] and Nsaids   Review of Systems Review of Systems  Constitutional:  Negative for fever.  Eyes:  Positive for pain. Negative for photophobia, discharge, redness, itching and visual disturbance.  Respiratory:  Negative for apnea, cough, choking, chest tightness, shortness of breath, wheezing and stridor.   All other systems reviewed and are negative.    Physical Exam Triage Vital Signs ED Triage Vitals  Encounter Vitals Group     BP 06/09/23 1924 (!) 144/96     Systolic BP Percentile --      Diastolic BP Percentile --      Pulse Rate 06/09/23 1924 67     Resp 06/09/23 1924 15     Temp 06/09/23 1924 98.7 F (37.1 C)     Temp Source 06/09/23 1924 Oral     SpO2 06/09/23 1924 98 %     Weight 06/09/23 1923 205 lb 0.4 oz (93 kg)     Height 06/09/23 1923 6' 2 (1.88 m)     Head Circumference --      Peak Flow  --      Pain Score 06/09/23 1923 5     Pain Loc --      Pain Education --      Exclude from Growth Chart --    No data found.  Updated Vital Signs BP (!) 144/96 (BP Location: Left Arm)   Pulse 67   Temp 98.7 F (37.1 C) (Oral)   Resp 15   Ht 6' 2 (1.88 m)   Wt 205 lb 0.4 oz (93 kg)   SpO2 98%   BMI 26.32 kg/m   Visual Acuity Right Eye Distance: 20/13 uncorrected Left Eye Distance: 20/15 uncorrected Bilateral Distance: 20/13 uncorrected  Right Eye Near:   Left Eye Near:    Bilateral Near:     Physical Exam Vitals and nursing note reviewed.  Constitutional:      Appearance: Normal appearance. He is well-developed and well-groomed.  HENT:     Head: Normocephalic.  Eyes:     General: Lids are normal. Lids are everted, no foreign bodies appreciated. Vision grossly intact.     Extraocular Movements: Extraocular movements intact.     Conjunctiva/sclera: Conjunctivae normal.     Pupils:     Left eye: Corneal abrasion and fluorescein  uptake present.  Cardiovascular:     Rate and Rhythm: Normal rate and regular rhythm.     Heart sounds: Normal heart sounds.  Pulmonary:     Effort: Pulmonary effort is normal.     Breath sounds: Normal breath sounds and air entry.  Musculoskeletal:     Cervical back: Normal range of motion.  Neurological:     General: No focal deficit present.     Mental Status: He is alert and oriented to person, place, and time.     Cranial Nerves: No cranial nerve deficit.     Sensory: No sensory deficit.  Psychiatric:        Attention and Perception: Attention normal.        Mood and Affect: Mood normal.        Speech: Speech normal.        Behavior: Behavior is cooperative.      UC Treatments / Results  Labs (all labs ordered are listed, but only abnormal results are displayed) Labs Reviewed - No data to display  EKG   Radiology No results found.  Procedures Procedures (including critical care time)  Medications Ordered in  UC Medications  tetracaine  (PONTOCAINE) 0.5 % ophthalmic solution 2 drop (has no administration in time range)  fluorescein  ophthalmic strip 1  strip (has no administration in time range)    Initial Impression / Assessment and Plan / UC Course  I have reviewed the triage vital signs and the nursing notes.  Pertinent labs & imaging results that were available during my care of the patient were reviewed by me and considered in my medical decision making (see chart for details).    Discussed exam findings and plan of care with patient, prescription erythromycin  for left corneal abrasion, strict go to ER precautions given if patient has new or worsening symptoms loss of vision go immediately to the emergency room.  Recommend sleep study regarding report of sleep apnea last night  Ddx: Left corneal abrasion, eye injury, sleep apnea Final Clinical Impressions(s) / UC Diagnoses   Final diagnoses:  Abrasion of left cornea, initial encounter  Sleep apnea, unspecified type     Discharge Instructions      Use eye ointment as, wash hands before and after use If you have change in your vision or worsening symptoms go to the emergency room for further evaluation Recommend sleep study-set up with PCP or pulmonologist Avoid smoking, avoid spicy greasy fried foods, make sure he set up 3 hours after eating before you go to bed    ED Prescriptions     Medication Sig Dispense Auth. Provider   erythromycin  ophthalmic ointment Place a 1/2 inch ribbon of ointment into the left lower eyelid every 6 hours x 5 days 3.5 g Ling Flesch, Rilla, NP      PDMP not reviewed this encounter.   Aminta Rilla, NP 06/09/23 2019

## 2023-06-09 NOTE — ED Triage Notes (Signed)
 Patient states that yesterday he had a rock go up under his safety glasses and hit his left eye.  Patient not sure if he has something in his left eye.  Patient states that he woke up during last night and could not catch his breath.

## 2023-06-13 ENCOUNTER — Ambulatory Visit
Admission: RE | Admit: 2023-06-13 | Discharge: 2023-06-13 | Disposition: A | Discharge status: Home / Self Care | Payer: Medicaid Other | Attending: Pediatrics | Admitting: Pediatrics

## 2023-06-13 ENCOUNTER — Ambulatory Visit: Payer: Medicaid Other | Admitting: Pediatrics

## 2023-06-13 ENCOUNTER — Ambulatory Visit
Admission: RE | Admit: 2023-06-13 | Discharge: 2023-06-13 | Disposition: A | Discharge status: Home / Self Care | Payer: Medicaid Other | Source: Ambulatory Visit

## 2023-06-13 VITALS — BP 115/76 | HR 69 | Temp 98.3°F | Wt 218.2 lb

## 2023-06-13 DIAGNOSIS — R0602 Shortness of breath: Secondary | ICD-10-CM | POA: Insufficient documentation

## 2023-06-13 DIAGNOSIS — G4730 Sleep apnea, unspecified: Secondary | ICD-10-CM

## 2023-06-13 DIAGNOSIS — R0789 Other chest pain: Secondary | ICD-10-CM

## 2023-06-13 DIAGNOSIS — R400 Somnolence: Secondary | ICD-10-CM | POA: Diagnosis not present

## 2023-06-13 DIAGNOSIS — R5383 Other fatigue: Secondary | ICD-10-CM

## 2023-06-13 NOTE — Patient Instructions (Addendum)
[facility] [address] [place],  [place]  [address]  For your chest xray you can walk in  Plan: - labs - stress test - sleep test - xray

## 2023-06-13 NOTE — Progress Notes (Unsigned)
Office Visit  BP 115/76 (BP Location: Right Arm, Patient Position: Sitting, Cuff Size: Normal)   Pulse 69   Temp 98.3 F (36.8 C) (Oral)   Wt 218 lb 3.2 oz (99 kg)   SpO2 100%   BMI 28.02 kg/m    Subjective:    Patient ID: Joel Mills, male    DOB: Aug 06, 1980, 43 y.o.   MRN: [id number]  HPI: Joel Mills is a 43 y.o. male  Chief Complaint  Patient presents with   Breathing Problem    Thursday night couldn't exhale haven't haven't had any since then went to [facility] for breathing issue to follow up with his PCP.     Discussed the use of AI scribe software for clinical note transcription with the patient, who gave verbal consent to proceed.  History of Present Illness   Joel Mills is a 43 year old male who presents with breathing difficulty and chest discomfort.  He experienced a recent episode of severe breathing difficulty on Friday morning around 3 AM. He describes waking up suddenly, unable to inhale or exhale, with his mouth open but no air movement. This episode lasted approximately 20 to 30 seconds, although it felt longer to him. He managed to regain some breathing by pounding on his chest, eventually taking short, raspy breaths before returning to normal breathing after a few minutes. This was the first time he experienced such an episode, although he has had previous episodes of waking up feeling like he was choking, which he attributes to acid reflux. No recent illnesses such as a cold, flu, or COVID-19.  He reports experiencing sharp pain on the left side of his chest, a couple of inches left and center from the nipple, described as a cramp-like sensation that takes his breath away. This pain has been occurring more regularly over the past few months, almost weekly, and has been present for at least six months. He associates this pain with a need to stretch and take shallow breaths to avoid exacerbating the pain. No associated arm pain or other cardiac symptoms.  He has a  history of snoring, as noted by his wife, and underwent a sleep study over 15 years ago, which indicated he did not reach REM sleep until early morning. He reports feeling tired during the day.  He has a history of acid reflux, which he manages by being mindful of his diet, avoiding certain foods like bell peppers and greasy items. He previously used Protonix but is not currently on any medication for acid reflux. He also has a history of ulcers, which led to hospitalization in the past.  He reports a history of anemia from his teenage years, for which he was treated with iron supplements.  He smoked cigarettes and marijuana in the past but does not currently smoke. He maintains an active lifestyle with gym and sports participation.      Height:    Weight: 218 lb 3.2 oz (99 kg) BMI: Body mass index is 28.02 kg/m.  Do you snore loudly (louder than talking or loud enough to be heard through closed doors)?  Yes Do you often feel tired, fatigued, or sleepy during daytime?  Yes Has anyone observed you stop breathing during your sleep?  Yes Do you have or are you being treated for high blood pressure?  No BMI more than 35 kg/m2?  No Age over 50 years?  No Neck circumference greater than 40 cm?  No Male gender?  Yes  Total "Yes"  Answers: 4   Relevant past medical, surgical, family and social history reviewed and updated as indicated. Interim medical history since our last visit reviewed. Allergies and medications reviewed and updated.  ROS per HPI unless specifically indicated above     Objective:    BP 115/76 (BP Location: Right Arm, Patient Position: Sitting, Cuff Size: Normal)   Pulse 69   Temp 98.3 F (36.8 C) (Oral)   Wt 218 lb 3.2 oz (99 kg)   SpO2 100%   BMI 28.02 kg/m   Wt Readings from Last 3 Encounters:  06/13/23 218 lb 3.2 oz (99 kg)  06/09/23 205 lb 0.4 oz (93 kg)  12/29/22 205 lb (93 kg)     Physical Exam Constitutional:      Appearance: Normal  appearance.  HENT:     Head: Normocephalic and atraumatic.  Eyes:     Pupils: Pupils are equal, round, and reactive to light.  Cardiovascular:     Rate and Rhythm: Normal rate and regular rhythm.     Pulses: Normal pulses.     Heart sounds: Normal heart sounds.  Pulmonary:     Effort: Pulmonary effort is normal.     Breath sounds: Normal breath sounds.  Abdominal:     General: Abdomen is flat.     Palpations: Abdomen is soft.  Musculoskeletal:        General: Normal range of motion.     Cervical back: Normal range of motion.  Skin:    General: Skin is warm and dry.     Capillary Refill: Capillary refill takes less than 2 seconds.  Neurological:     General: No focal deficit present.     Mental Status: He is alert. Mental status is at baseline.  Psychiatric:        Mood and Affect: Mood normal.        Behavior: Behavior normal.         06/21/2022    4:35 PM 03/30/2022    2:40 PM 08/13/2020    8:35 AM 08/03/2020    9:49 AM 07/23/2020    8:51 AM  Depression screen PHQ 2/9  Decreased Interest 2 1 3  0 0  Down, Depressed, Hopeless 1 1 3  0 0  PHQ - 2 Score 3 2 6  0 0  Altered sleeping 3 2 3  1   Tired, decreased energy 2 2 3   0  Change in appetite 1 0 1  0  Feeling bad or failure about yourself  0 0 1  0  Trouble concentrating 2 0 2  0  Moving slowly or fidgety/restless 1 0 0  0  Suicidal thoughts 0 0 0  0  PHQ-9 Score 12 6 16  1   Difficult doing work/chores Somewhat difficult Not difficult at all   Not difficult at all       06/21/2022    4:36 PM 03/30/2022    2:40 PM 08/13/2020    8:37 AM 10/24/2019    9:25 AM  GAD 7 : Generalized Anxiety Score  Nervous, Anxious, on Edge 1 1 3 2   Control/stop worrying 1 0 0 0  Worry too much - different things 0 0 0 1  Trouble relaxing 2 1 3 2   Restless 2 1 0 3  Easily annoyed or irritable 2 1 3 2   Afraid - awful might happen 0 0 0 0  Total GAD 7 Score 8 4 9 10   Anxiety Difficulty Somewhat difficult Not difficult at all  Somewhat  difficult       Assessment & Plan:  Assessment & Plan   Somnolence, daytime -     Ambulatory referral to Sleep Studies  Shortness of breath -     Brain natriuretic peptide -     D-dimer, quantitative -     CBC -     Iron, TIBC and Ferritin Panel -     DG Chest 2 View; Future -     Ambulatory referral to Cardiology  Other fatigue -     Comprehensive metabolic panel     Assessment and Plan    Acute Respiratory Distress Patient experienced an episode of acute respiratory distress upon waking, with no recurrence. No associated chest pain or other cardiac symptoms. -Order chest X-ray to rule out any lung pathology. -Order BNP to assess for cardiac stress. -Consider treadmill stress test to rule out cardiac issues.  Chronic Chest Discomfort Patient reports chronic, sharp, cramp-like pain on the left side of the chest, exacerbated by deep breathing. This has been ongoing for at least six months and has become more frequent in the past couple of months. -Order chest X-ray to rule out any lung pathology. -Order BNP to assess for cardiac stress. -Consider treadmill stress test to rule out cardiac issues.  Possible Sleep Apnea Patient reports history of snoring and waking up feeling like choking, but with awareness during these episodes. Patient also reports chronic daytime tiredness. -Order home sleep study to assess for sleep apnea.  Gastroesophageal Reflux Disease (GERD) Patient reports long-standing history of acid reflux, managed by diet modification. No current medication for acid reflux. -Consider restarting Protonix for 3-4 months to control symptoms, if needed.  Anemia Patient reports history of anemia and recent fatigue. -Order iron studies to assess for current anemia status.  General Health Maintenance -Check blood pressure again before patient leaves due to initial high reading. -Order baseline cardiac labs.         Follow up plan: Return if symptoms worsen  or fail to improve.  Shia Eber Howell Pringle, MD

## 2023-06-14 ENCOUNTER — Encounter: Payer: Self-pay | Admitting: Pediatrics

## 2023-06-14 LAB — IRON,TIBC AND FERRITIN PANEL
Ferritin: 137 ng/mL (ref 30–400)
Iron Saturation: 21 % (ref 15–55)
Iron: 66 ug/dL (ref 38–169)
Total Iron Binding Capacity: 314 ug/dL (ref 250–450)
UIBC: 248 ug/dL (ref 111–343)

## 2023-06-14 LAB — COMPREHENSIVE METABOLIC PANEL
ALT: 21 [IU]/L (ref 0–44)
AST: 18 [IU]/L (ref 0–40)
Albumin: 4.6 g/dL (ref 4.1–5.1)
Alkaline Phosphatase: 91 [IU]/L (ref 44–121)
BUN/Creatinine Ratio: 11 (ref 9–20)
BUN: 11 mg/dL (ref 6–24)
Bilirubin Total: 0.4 mg/dL (ref 0.0–1.2)
CO2: 24 mmol/L (ref 20–29)
Calcium: 9.4 mg/dL (ref 8.7–10.2)
Chloride: 101 mmol/L (ref 96–106)
Creatinine, Ser: 0.99 mg/dL (ref 0.76–1.27)
Globulin, Total: 2.3 g/dL (ref 1.5–4.5)
Glucose: 89 mg/dL (ref 70–99)
Potassium: 4.5 mmol/L (ref 3.5–5.2)
Sodium: 138 mmol/L (ref 134–144)
Total Protein: 6.9 g/dL (ref 6.0–8.5)
eGFR: 98 mL/min/{1.73_m2} (ref >59)

## 2023-06-14 LAB — CBC
Hematocrit: 48.2 % (ref 37.5–51.0)
Hemoglobin: 15.8 g/dL (ref 13.0–17.7)
MCH: 30.8 pg (ref 26.6–33.0)
MCHC: 32.8 g/dL (ref 31.5–35.7)
MCV: 94 fL (ref 79–97)
Platelets: 189 10*3/uL (ref 150–450)
RBC: 5.13 x10E6/uL (ref 4.14–5.80)
RDW: 13.2 % (ref 11.6–15.4)
WBC: 5.2 10*3/uL (ref 3.4–10.8)

## 2023-06-14 LAB — D-DIMER, QUANTITATIVE: D-DIMER: <0.2 mg{FEU}/L (ref 0.00–0.49)

## 2023-06-14 LAB — BRAIN NATRIURETIC PEPTIDE: BNP: 5.5 pg/mL (ref 0.0–100.0)

## 2023-06-14 NOTE — Assessment & Plan Note (Signed)
Patient reports history of snoring and waking up feeling like choking, but with awareness during these episodes. Patient also reports chronic daytime tiredness. Exam reassuring, working up daytime SOB/deconditioning as discussed above. -Order home sleep study to assess for sleep apnea.

## 2023-07-12 ENCOUNTER — Encounter: Payer: Self-pay | Admitting: Neurology

## 2023-07-12 ENCOUNTER — Ambulatory Visit (INDEPENDENT_AMBULATORY_CARE_PROVIDER_SITE_OTHER): Payer: Medicaid Other | Admitting: Neurology

## 2023-07-12 VITALS — BP 118/70 | HR 87 | Ht 73.0 in | Wt 217.0 lb

## 2023-07-12 DIAGNOSIS — R0683 Snoring: Secondary | ICD-10-CM | POA: Diagnosis not present

## 2023-07-12 DIAGNOSIS — F908 Attention-deficit hyperactivity disorder, other type: Secondary | ICD-10-CM | POA: Diagnosis not present

## 2023-07-12 DIAGNOSIS — G4733 Obstructive sleep apnea (adult) (pediatric): Secondary | ICD-10-CM | POA: Insufficient documentation

## 2023-07-12 DIAGNOSIS — G479 Sleep disorder, unspecified: Secondary | ICD-10-CM | POA: Diagnosis not present

## 2023-07-12 DIAGNOSIS — G4719 Other hypersomnia: Secondary | ICD-10-CM | POA: Insufficient documentation
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
## 2023-07-12 NOTE — Patient Instructions (Addendum)
 I    EDS, hypersomnia, restless sleep and insomnia. ADHD.     1)  history or recurrent hypersomnia, now reaching the level of excessive daytime sleepiness.  Snoring   2) insomnia  is stress related, but early waking up because of GERD choking, snoring, and numbness in  fingers and feet.    3) he is a restless sleeper and  total sleep time may be 4-5 hours rather than 7 hours  that he spends in bed.    We discussed today the parents patient's preference of not using sleep aids which I fully support I do however recommend to try magnesium over-the-counter if it has to do if his sleep interruptions have to do with numbness tingling or muscle cramping, it also seems to relax patient's better.  Melatonin is an nonaddictive medication that can be used but I do not recommend more than 5 mg at night.  In addition I do want to look at the possibility of sleep apnea being present there are some risk factors here and narrow upper airway, crowded dental status and history of snoring.  So my goal will be to order a home sleep test to rule out that apnea is the main sleep disorder here.  Sleep hygiene is important so the bedroom should be cool quiet and dark and I object strongly to having a TV in the bedroom.   I like for the patient to sleep in a room that is quiet- if there is a background noise , it should be the humming of a fan or a non-vocal soft music.   There should be no screen light.    I would recommend not to look at the clock as it is often leading to more anxiety about obtaining enough sleep and it can be counterproductive and insomnia.     I will order the home sleep test that could be mailed to the patient or he can pick the device up here it will take about 2 to 3 weeks usually before we have a approval from the insurance company.  With will then follow-up on the results by MyChart #1 and if treatment is indicated we may initiate treatment before we see the patient again.  Revisit will be  scheduled for 3 to 5 months from now.  I nsomnia Insomnia is a sleep disorder that makes it difficult to fall asleep or stay asleep. Insomnia can cause fatigue, low energy, difficulty concentrating, mood swings, and poor performance at work or school. There are three different ways to classify insomnia: Difficulty falling asleep. Difficulty staying asleep. Waking up too early in the morning. Any type of insomnia can be long-term (chronic) or short-term (acute). Both are common. Short-term insomnia usually lasts for 3 months or less. Chronic insomnia occurs at least three times a week for longer than 3 months. What are the causes? Insomnia may be caused by another condition, situation, or substance, such as: Having certain mental health conditions, such as anxiety and depression. Using caffeine, alcohol, tobacco, or drugs. Having gastrointestinal conditions, such as gastroesophageal reflux disease (GERD). Having certain medical conditions. These include: Asthma. Alzheimer's disease. Stroke. Chronic pain. An overactive thyroid gland (hyperthyroidism). Other sleep disorders, such as restless legs syndrome and sleep apnea. Menopause. Sometimes, the cause of insomnia may not be known. What increases the risk? Risk factors for insomnia include: Gender. Females are affected more often than males. Age. Insomnia is more common as people get older. Stress and certain medical and mental health conditions.  Lack of exercise. Having an irregular work schedule. This may include working night shifts and traveling between different time zones. What are the signs or symptoms? If you have insomnia, the main symptom is having trouble falling asleep or having trouble staying asleep. This may lead to other symptoms, such as: Feeling tired or having low energy. Feeling nervous about going to sleep. Not feeling rested in the morning. Having trouble concentrating. Feeling irritable, anxious, or  depressed. How is this diagnosed? This condition may be diagnosed based on: Your symptoms and medical history. Your health care provider may ask about: Your sleep habits. Any medical conditions you have. Your mental health. A physical exam. How is this treated? Treatment for insomnia depends on the cause. Treatment may focus on treating an underlying condition that is causing the insomnia. Treatment may also include: Medicines to help you sleep. Counseling or therapy. Lifestyle adjustments to help you sleep better. Follow these instructions at home: Eating and drinking  Limit or avoid alcohol, caffeinated beverages, and products that contain nicotine and tobacco, especially close to bedtime. These can disrupt your sleep. Do not eat a large meal or eat spicy foods right before bedtime. This can lead to digestive discomfort that can make it hard for you to sleep. Sleep habits  Keep a sleep diary to help you and your health care provider figure out what could be causing your insomnia. Write down: When you sleep. When you wake up during the night. How well you sleep and how rested you feel the next day. Any side effects of medicines you are taking. What you eat and drink. Make your bedroom a dark, comfortable place where it is easy to fall asleep. Put up shades or blackout curtains to block light from outside. Use a white noise machine to block noise. Keep the temperature cool. Limit screen use before bedtime. This includes: Not watching TV. Not using your smartphone, tablet, or computer. Stick to a routine that includes going to bed and waking up at the same times every day and night. This can help you fall asleep faster. Consider making a quiet activity, such as reading, part of your nighttime routine. Try to avoid taking naps during the day so that you sleep better at night. Get out of bed if you are still awake after 15 minutes of trying to sleep. Keep the lights down, but try  reading or doing a quiet activity. When you feel sleepy, go back to bed. General instructions Take over-the-counter and prescription medicines only as told by your health care provider. Exercise regularly as told by your health care provider. However, avoid exercising in the hours right before bedtime. Use relaxation techniques to manage stress. Ask your health care provider to suggest some techniques that may work well for you. These may include: Breathing exercises. Routines to release muscle tension. Visualizing peaceful scenes. Make sure that you drive carefully. Do not drive if you feel very sleepy. Keep all follow-up visits. This is important. Contact a health care provider if: You are tired throughout the day. You have trouble in your daily routine due to sleepiness. You continue to have sleep problems, or your sleep problems get worse. Get help right away if: You have thoughts about hurting yourself or someone else. Get help right away if you feel like you may hurt yourself or others, or have thoughts about taking your own life. Go to your nearest emergency room or: Call 911. Call the National Suicide Prevention Lifeline at (724)883-4451 or 988.  This is open 24 hours a day. Text the Crisis Text Line at 713-669-4975. Summary Insomnia is a sleep disorder that makes it difficult to fall asleep or stay asleep. Insomnia can be long-term (chronic) or short-term (acute). Treatment for insomnia depends on the cause. Treatment may focus on treating an underlying condition that is causing the insomnia. Keep a sleep diary to help you and your health care provider figure out what could be causing your insomnia. This information is not intended to replace advice given to you by your health care provider. Make sure you discuss any questions you have with your health care provider. Document Revised: 03/29/2021 Document Reviewed: 03/29/2021 Elsevier Patient Education  2024 [employer].  Insomnia Insomnia is a sleep disorder that makes it difficult to fall asleep or stay asleep. Insomnia can cause fatigue, low energy, difficulty concentrating, mood swings, and poor performance at work or school. There are three different ways to classify insomnia: Difficulty falling asleep. Difficulty staying asleep. Waking up too early in the morning. Any type of insomnia can be long-term (chronic) or short-term (acute). Both are common. Short-term insomnia usually lasts for 3 months or less. Chronic insomnia occurs at least three times a week for longer than 3 months. What are the causes? Insomnia may be caused by another condition, situation, or substance, such as: Having certain mental health conditions, such as anxiety and depression. Using caffeine, alcohol, tobacco, or drugs. Having gastrointestinal conditions, such as gastroesophageal reflux disease (GERD). Having certain medical conditions. These include: Asthma. Alzheimer's disease. Stroke. Chronic pain. An overactive thyroid gland (hyperthyroidism). Other sleep disorders, such as restless legs syndrome and sleep apnea. Menopause. Sometimes, the cause of insomnia may not be known. What increases the risk? Risk factors for insomnia include: Gender. Females are affected more often than males. Age. Insomnia is more common as people get older. Stress and certain medical and mental health conditions. Lack of exercise. Having an irregular work schedule. This may include working night shifts and traveling between different time zones. What are the signs or symptoms? If you have insomnia, the main symptom is having trouble falling asleep or having trouble staying asleep. This may lead to other symptoms, such as: Feeling tired or having low energy. Feeling nervous about going to sleep. Not feeling rested in the morning. Having trouble concentrating. Feeling irritable, anxious, or depressed. How is this diagnosed? This  condition may be diagnosed based on: Your symptoms and medical history. Your health care provider may ask about: Your sleep habits. Any medical conditions you have. Your mental health. A physical exam. How is this treated? Treatment for insomnia depends on the cause. Treatment may focus on treating an underlying condition that is causing the insomnia. Treatment may also include: Medicines to help you sleep. Counseling or therapy. Lifestyle adjustments to help you sleep better. Follow these instructions at home: Eating and drinking  Limit or avoid alcohol, caffeinated beverages, and products that contain nicotine and tobacco, especially close to bedtime. These can disrupt your sleep. Do not eat a large meal or eat spicy foods right before bedtime. This can lead to digestive discomfort that can make it hard for you to sleep. Sleep habits  Keep a sleep diary to help you and your health care provider figure out what could be causing your insomnia. Write down: When you sleep. When you wake up during the night. How well you sleep and how rested you feel the next day. Any side effects of medicines you are taking. What you  eat and drink. Make your bedroom a dark, comfortable place where it is easy to fall asleep. Put up shades or blackout curtains to block light from outside. Use a white noise machine to block noise. Keep the temperature cool. Limit screen use before bedtime. This includes: Not watching TV. Not using your smartphone, tablet, or computer. Stick to a routine that includes going to bed and waking up at the same times every day and night. This can help you fall asleep faster. Consider making a quiet activity, such as reading, part of your nighttime routine. Try to avoid taking naps during the day so that you sleep better at night. Get out of bed if you are still awake after 15 minutes of trying to sleep. Keep the lights down, but try reading or doing a quiet activity. When you  feel sleepy, go back to bed. General instructions Take over-the-counter and prescription medicines only as told by your health care provider. Exercise regularly as told by your health care provider. However, avoid exercising in the hours right before bedtime. Use relaxation techniques to manage stress. Ask your health care provider to suggest some techniques that may work well for you. These may include: Breathing exercises. Routines to release muscle tension. Visualizing peaceful scenes. Make sure that you drive carefully. Do not drive if you feel very sleepy. Keep all follow-up visits. This is important. Contact a health care provider if: You are tired throughout the day. You have trouble in your daily routine due to sleepiness. You continue to have sleep problems, or your sleep problems get worse. Get help right away if: You have thoughts about hurting yourself or someone else. Get help right away if you feel like you may hurt yourself or others, or have thoughts about taking your own life. Go to your nearest emergency room or: Call 911. Call the National Suicide Prevention Lifeline at 720-553-7143 or 988. This is open 24 hours a day. Text the Crisis Text Line at 603-427-2513. Summary Insomnia is a sleep disorder that makes it difficult to fall asleep or stay asleep. Insomnia can be long-term (chronic) or short-term (acute). Treatment for insomnia depends on the cause. Treatment may focus on treating an underlying condition that is causing the insomnia. Keep a sleep diary to help you and your health care provider figure out what could be causing your insomnia. This information is not intended to replace advice given to you by your health care provider. Make sure you discuss any questions you have with your health care provider. Document Revised: 03/29/2021 Document Reviewed: 03/29/2021 Elsevier Patient Education  2024 [employer].

## 2023-07-17 ENCOUNTER — Other Ambulatory Visit: Payer: Self-pay

## 2023-07-17 ENCOUNTER — Emergency Department

## 2023-07-17 DIAGNOSIS — S0083XA Contusion of other part of head, initial encounter: Secondary | ICD-10-CM | POA: Insufficient documentation

## 2023-07-17 DIAGNOSIS — S0990XA Unspecified injury of head, initial encounter: Secondary | ICD-10-CM | POA: Diagnosis present

## 2023-07-17 DIAGNOSIS — W228XXA Striking against or struck by other objects, initial encounter: Secondary | ICD-10-CM | POA: Insufficient documentation

## 2023-07-17 DIAGNOSIS — Y9367 Activity, basketball: Secondary | ICD-10-CM | POA: Diagnosis not present

## 2023-07-17 MED ORDER — OXYCODONE-ACETAMINOPHEN 5-325 MG PO TABS
1.0000 | ORAL_TABLET | Freq: Once | ORAL | Status: AC
  Administered 2023-07-17: 1 via ORAL
  Filled 2023-07-17: qty 1

## 2023-07-17 NOTE — ED Triage Notes (Signed)
 Pt reports he got kneed in the right side of his face while playing basketball. Wife reports pt has been slow to walk and respond since. Pt has bruising noted to right eye.

## 2023-07-18 ENCOUNTER — Emergency Department
Admission: EM | Admit: 2023-07-18 | Discharge: 2023-07-18 | Disposition: A | Discharge status: Home / Self Care | Attending: Emergency Medicine | Admitting: Emergency Medicine

## 2023-07-18 ENCOUNTER — Telehealth: Payer: Self-pay | Admitting: Neurology

## 2023-07-18 DIAGNOSIS — S0990XA Unspecified injury of head, initial encounter: Secondary | ICD-10-CM

## 2023-07-18 DIAGNOSIS — S0083XA Contusion of other part of head, initial encounter: Secondary | ICD-10-CM

## 2023-07-18 NOTE — Telephone Encounter (Signed)
 [facility] pending faxed notes

## 2023-07-18 NOTE — Discharge Instructions (Signed)
 Take acetaminophen 650 mg and ibuprofen 400 mg every 6 hours for pain.  Take with food.  Thank you for choosing [place] for your health care today!  Please see your primary doctor this week for a follow up appointment.   If you have any new, worsening, or unexpected symptoms call your doctor right away or come back to the emergency department for reevaluation.  It was my pleasure to care for you today.   Daneil Dan Modesto Charon, MD
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
## 2023-07-24 NOTE — Telephone Encounter (Signed)
 [facility] still pending.

## 2023-07-27 NOTE — Telephone Encounter (Signed)
 [facility] no auth req via fax

## 2023-08-22 ENCOUNTER — Ambulatory Visit: Admitting: Neurology

## 2023-08-22 DIAGNOSIS — F908 Attention-deficit hyperactivity disorder, other type: Secondary | ICD-10-CM

## 2023-08-22 DIAGNOSIS — G8929 Other chronic pain: Secondary | ICD-10-CM

## 2023-08-22 DIAGNOSIS — G4733 Obstructive sleep apnea (adult) (pediatric): Secondary | ICD-10-CM | POA: Diagnosis not present

## 2023-08-22 DIAGNOSIS — G4719 Other hypersomnia: Secondary | ICD-10-CM

## 2023-08-22 DIAGNOSIS — G479 Sleep disorder, unspecified: Secondary | ICD-10-CM

## 2023-08-22 DIAGNOSIS — R0683 Snoring: Secondary | ICD-10-CM
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
## 2023-08-28 ENCOUNTER — Encounter: Payer: Self-pay | Admitting: Podiatry

## 2023-08-28 ENCOUNTER — Ambulatory Visit
Admission: EM | Admit: 2023-08-28 | Discharge: 2023-08-28 | Disposition: A | Discharge status: Home / Self Care | Attending: Emergency Medicine | Admitting: Emergency Medicine

## 2023-08-28 ENCOUNTER — Ambulatory Visit (INDEPENDENT_AMBULATORY_CARE_PROVIDER_SITE_OTHER)

## 2023-08-28 ENCOUNTER — Ambulatory Visit (INDEPENDENT_AMBULATORY_CARE_PROVIDER_SITE_OTHER): Admitting: Podiatry

## 2023-08-28 DIAGNOSIS — M84375A Stress fracture, left foot, initial encounter for fracture: Secondary | ICD-10-CM

## 2023-08-28 DIAGNOSIS — S92335D Nondisplaced fracture of third metatarsal bone, left foot, subsequent encounter for fracture with routine healing: Secondary | ICD-10-CM | POA: Diagnosis not present

## 2023-08-28 DIAGNOSIS — R600 Localized edema: Secondary | ICD-10-CM

## 2023-08-28 MED ORDER — OXYCODONE HCL 5 MG PO TABS
5.0000 mg | ORAL_TABLET | ORAL | 0 refills | Status: DC | PRN
Start: 2023-08-28 — End: 2023-09-07

## 2023-08-28 NOTE — Progress Notes (Signed)
 Chief Complaint  Patient presents with   Fracture    Saw urgent care this morning, XRs were done, may have injured originally last Saturday but yesterday he was playing and came down and thinks that's when the fracture happened, pain is all the time, pain level 9/10, urgent care didn't prescribe anything, is taking Tylenol  for the pain, iciing and keeping it elevated    HPI: 43 y.o. male presents today with left foot pain.  He reports injuring it originally 3 to 4 days ago.  Yesterday he reports he was playing basketball and came down awkwardly on the left foot.  He has had persistent pain since this point.  He was seen at urgent care where he had x-rays taken.  Past Medical History:  Diagnosis Date   Anemia    43 years old   Asthma    Depression     Past Surgical History:  Procedure Laterality Date   ADENOIDECTOMY     ESOPHAGOGASTRODUODENOSCOPY (EGD) WITH PROPOFOL  N/A 01/07/2018   Procedure: ESOPHAGOGASTRODUODENOSCOPY (EGD) WITH PROPOFOL ;  Surgeon: Toledo, Alphonsus Jeans, MD;  Location: [facility];  Service: Gastroenterology;  Laterality: N/A;   VASECTOMY      Allergies  Allergen Reactions   Cyclobenzaprine  Other (See Comments)    Mood swings per patient   Nsaids Hives   Ibuprofen     ROS denies any nausea, vomiting, fever, chills, chest pain, shortness of breath   Physical Exam: There were no vitals filed for this visit.  General: The patient is alert and oriented x3 in no acute distress.  Dermatology: Skin is warm, dry and supple bilateral lower extremities. Interspaces are clear of maceration and debris.  No ecchymosis noted  Vascular: Palpable pedal pulses bilaterally. Capillary refill within normal limits.  Edema present left foot.  No erythema or calor.  Neurological: Light touch sensation grossly intact bilateral feet.   Musculoskeletal Exam: Pain on palpation of left forefoot over 2nd and 3rd metatarsals.  No gross orthopedic deformity.  Radiographic  Exam: Outside images 08/28/2023 3 views left foot nonweightbearing I personally reviewed the images Demonstrate stress fracture of third metatarsal.  Stress reaction present to the medial aspect of the medial third metatarsal neck.  Fracture lines to extend and proximal oblique direction.  Lateral cortex likely intact.  There is also stress reaction present to the second metatarsal.  Assessment/Plan of Care: 1. Stress fracture of left foot, initial encounter   2. Edema of left foot      Meds ordered this encounter  Medications   oxyCODONE  (ROXICODONE ) 5 MG immediate release tablet    Sig: Take 1 tablet (5 mg total) by mouth every 4 (four) hours as needed for up to 6 doses for severe pain (pain score 7-10).    Dispense:  6 tablet    Refill:  0   None  Discussed clinical findings with patient today.  Reviewed images with patient.  Unna boot applied for pain and edema control.  Patient may remove this dressing himself in 7 to 10 days.  Instructed him to keep this dry.  Nonweightbearing in cam boot. Short course of oxycodone  prescribed for acute pain.  Following this he may take Tylenol .  Advised against regular NSAID use as this could slow down bone healing.  Obtaining vitamin D  level and serum calcium level.  Will supplement as necessary.  Follow-up in 4 to 6 weeks for repeat radiographs  Webber Michiels L. Lunda Salines, AACFAS [facility]  [address] [place], [place] [address]                Office [phone]  Fax [phone]

## 2023-08-28 NOTE — ED Provider Notes (Addendum)
 [facility]    CSN: [id number] Arrival date & time: 08/28/23  0810      History   Chief Complaint Chief Complaint  Patient presents with   Foot Pain    HPI Joel Mills is a 43 y.o. male.   HPI  43 year old male with past medical history significant for asthma, depression, ADHD, sleep apnea, excessive daytime sleepiness, tinnitus, and history of gastric ulcer presents for evaluation of pain and swelling to his left foot.  He reports he was playing volleyball yesterday and when he came down he landed on someone's foot causing him to Hyperflex his toes.  He had a similar injury playing basketball the week prior.  He is now endorsing numbness and tingling in his toes as well as significant pain with weightbearing.  He is able to move his toes but range of motion is limited secondary to pain.  Past Medical History:  Diagnosis Date   Anemia    43 years old   Asthma    Depression     Patient Active Problem List   Diagnosis Date Noted   Excessive daytime sleepiness 07/12/2023   ADHD, adult residual type 07/12/2023   Snoring 07/12/2023   Left corneal abrasion 06/09/2023   Sleep apnea 06/09/2023   Low testosterone  level in male 08/13/2022   Concentration deficit 06/21/2022   Fatigue 06/21/2022   Chronic left hip pain 06/21/2022   Cutaneous skin tags 06/21/2022   Tinnitus 03/30/2022   History of gastric ulcer 10/27/2019   Chronic bilateral low back pain with bilateral sciatica 09/24/2019   Anxiety and depression 09/27/2012   Hernia 09/26/2012    Past Surgical History:  Procedure Laterality Date   ADENOIDECTOMY     ESOPHAGOGASTRODUODENOSCOPY (EGD) WITH PROPOFOL  N/A 01/07/2018   Procedure: ESOPHAGOGASTRODUODENOSCOPY (EGD) WITH PROPOFOL ;  Surgeon: Toledo, Alphonsus Jeans, MD;  Location: [facility];  Service: Gastroenterology;  Laterality: N/A;   VASECTOMY         Home Medications    Prior to Admission medications   Medication Sig Start Date End Date Taking?  Authorizing Provider  pantoprazole  (PROTONIX ) 40 MG tablet Take 1 tablet (40 mg total) by mouth 2 (two) times daily before a meal. 01/07/18 10/27/18  Alexis Anger, MD    Family History Family History  Problem Relation Age of Onset   Diabetes Mother    Hypertension Father    Depression Father    Cancer Sister    Depression Sister     Social History Social History   Tobacco Use   Smoking status: Former    Current packs/day: 0.00    Types: Cigarettes    Quit date: 11/30/2018    Years since quitting: 4.7   Smokeless tobacco: Never  Vaping Use   Vaping status: Never Used  Substance Use Topics   Alcohol use: Yes    Comment: socially   Drug use: Not Currently    Frequency: 3.0 times per week    Types: Marijuana    Comment: smoke sometimes ( pt uses as sleep aid)     Allergies   Cyclobenzaprine  and Nsaids   Review of Systems Review of Systems  Musculoskeletal:  Positive for arthralgias and joint swelling.  Skin:  Positive for color change.  Neurological:  Positive for numbness. Negative for weakness.     Physical Exam Triage Vital Signs ED Triage Vitals [08/28/23 0814]  Encounter Vitals Group     BP      Systolic BP Percentile  Diastolic BP Percentile      Pulse      Resp 16     Temp      Temp Source Oral     SpO2      Weight      Height      Head Circumference      Peak Flow      Pain Score      Pain Loc      Pain Education      Exclude from Growth Chart    No data found.  Updated Vital Signs BP 93/71 (BP Location: Right Arm)   Pulse 77   Temp 98.4 F (36.9 C) (Oral)   Resp 16   Ht 6\' 3"  (1.905 m)   Wt 210 lb (95.3 kg)   SpO2 97%   BMI 26.25 kg/m   Visual Acuity Right Eye Distance:   Left Eye Distance:   Bilateral Distance:    Right Eye Near:   Left Eye Near:    Bilateral Near:     Physical Exam Vitals and nursing note reviewed.  Constitutional:      Appearance: Normal appearance. He is not ill-appearing.  HENT:     Head:  Normocephalic and atraumatic.  Musculoskeletal:        General: Swelling, tenderness and signs of injury present. No deformity.  Skin:    General: Skin is warm and dry.     Capillary Refill: Capillary refill takes less than 2 seconds.     Findings: Bruising present.  Neurological:     General: No focal deficit present.     Mental Status: He is alert and oriented to person, place, and time.     Sensory: Sensory deficit present.      UC Treatments / Results  Labs (all labs ordered are listed, but only abnormal results are displayed) Labs Reviewed - No data to display  EKG   Radiology DG Foot Complete Left Result Date: 08/28/2023 CLINICAL DATA:  Rolling injury yesterday. Left foot pain and swelling. EXAM: LEFT FOOT - COMPLETE 3 VIEW COMPARISON:  None Available. FINDINGS: There is no evidence of acute fracture or dislocation. A healing subacute stress fracture of the distal 3rd metatarsal shaft is seen. No other osseous abnormality identified IMPRESSION: Healing subacute stress fracture of the distal 3rd metatarsal shaft. Electronically Signed   By: Marlyce Sine M.D.   On: 08/28/2023 08:46    Procedures Procedures (including critical care time)  Medications Ordered in UC Medications - No data to display  Initial Impression / Assessment and Plan / UC Course  I have reviewed the triage vital signs and the nursing notes.  Pertinent labs & imaging results that were available during my care of the patient were reviewed by me and considered in my medical decision making (see chart for details).   Patient is a pleasant, nontoxic-appearing 43 year old male presenting for evaluation of pain and numbness in his left toes after suffering a hyperflexion injury yesterday while playing volleyball.  As you can see in image above, there is swelling across the distal metatarsals, more so over the 2nd through 5th.  There is mild ecchymosis forming though it is difficult to visualize in the above  image.  Area is tender to touch but I do not appreciate any crepitus.  Cap refills less than 3 seconds.  DP and PT pulses are 2+.  I will obtain radiograph of left foot to rule out any bony abnormality.  Left foot x-rays independently  reviewed and evaluated by me.  Impression: Incomplete fracture of the distal shaft of the third metatarsal.  No other bony abnormalities noted.  Radiology overread is pending. Radiology impression states there is a healing subacute stress fracture of the distal third metatarsal shaft.  I will discharge patient home in a postop shoe and crutches with a diagnosis of third metatarsal fracture and have him abstain from physical activity for the next 4 to 6 weeks to allow for healing.  I will also refer him to podiatry to monitor healing.  Return precautions reviewed.  Work note provided.   Final Clinical Impressions(s) / UC Diagnoses   Final diagnoses:  Closed nondisplaced fracture of third metatarsal bone of left foot with routine healing, subsequent encounter     Discharge Instructions      Your x-rays show that you have an incomplete fracture of the distal shaft of your third metatarsal.  This is most likely caused by your injury 1 week ago playing basketball and exacerbated while playing volleyball yesterday.  Avoid any running, jumping, ballistic activities or exercises, or contact activity for the next 4 to 6 weeks until you are fracture heals.  Wear the postop shoe to help minimize flexion of your foot and allow for better healing.  Keep your left foot elevated is much as possible to help decrease pain and swelling.  You may apply ice to your foot for 20 minutes at a time, 2-3 times a day, to help with pain and swelling.  Make sure you have a cloth between the ice and your skin so as to not cause any skin damage.  You may use over-the-counter Tylenol  and/or ibuprofen according to the package directions as needed for pain.  I have referred you to  podiatry for reevaluation and to monitor your healing.  They will contact you for an appointment.     ED Prescriptions   None    PDMP not reviewed this encounter.   Kent Pear, NP 08/28/23 0981    Kent Pear, NP 08/28/23 [phone]

## 2023-08-28 NOTE — Discharge Instructions (Addendum)
 Your x-rays show that you have an incomplete fracture of the distal shaft of your third metatarsal.  This is most likely caused by your injury 1 week ago playing basketball and exacerbated while playing volleyball yesterday.  Avoid any running, jumping, ballistic activities or exercises, or contact activity for the next 4 to 6 weeks until you are fracture heals.  Wear the postop shoe to help minimize flexion of your foot and allow for better healing.  Keep your left foot elevated is much as possible to help decrease pain and swelling.  You may apply ice to your foot for 20 minutes at a time, 2-3 times a day, to help with pain and swelling.  Make sure you have a cloth between the ice and your skin so as to not cause any skin damage.  You may use over-the-counter Tylenol  and/or ibuprofen according to the package directions as needed for pain.  I have referred you to podiatry for reevaluation and to monitor your healing.  They will contact you for an appointment.

## 2023-08-28 NOTE — ED Triage Notes (Signed)
 Pt c/o L foot pain & edema x1 day. States was playing volleyball yesterday landed on someone else's foot & made it roll.

## 2023-08-29 LAB — VITAMIN D 25 HYDROXY (VIT D DEFICIENCY, FRACTURES): Vit D, 25-Hydroxy: 22.9 ng/mL — ABNORMAL LOW (ref 30.0–100.0)

## 2023-08-29 LAB — CALCIUM: Calcium: 8.9 mg/dL (ref 8.7–10.2)

## 2023-08-31 ENCOUNTER — Encounter: Payer: Self-pay | Admitting: Podiatry

## 2023-08-31 MED ORDER — VITAMIN D3 25 MCG (1000 UT) PO CAPS: 1000.0000 [IU] | ORAL_CAPSULE | Freq: Every day | ORAL | 0 refills | Status: AC

## 2023-09-01 ENCOUNTER — Ambulatory Visit: Payer: Medicaid Other | Attending: Cardiology | Admitting: Cardiology

## 2023-09-01 ENCOUNTER — Other Ambulatory Visit: Payer: Self-pay | Admitting: Neurology

## 2023-09-01 ENCOUNTER — Encounter: Payer: Self-pay | Admitting: Nurse Practitioner

## 2023-09-01 ENCOUNTER — Encounter: Payer: Self-pay | Admitting: Cardiology

## 2023-09-01 ENCOUNTER — Encounter: Payer: Self-pay | Admitting: Neurology

## 2023-09-01 VITALS — BP 106/72 | HR 78 | Ht 75.0 in | Wt 210.0 lb

## 2023-09-01 DIAGNOSIS — R072 Precordial pain: Secondary | ICD-10-CM

## 2023-09-01 DIAGNOSIS — R0602 Shortness of breath: Secondary | ICD-10-CM | POA: Diagnosis present

## 2023-09-01 MED ORDER — IVABRADINE HCL 7.5 MG PO TABS: 15.0000 mg | ORAL_TABLET | Freq: Two times a day (BID) | ORAL | 0 refills | Status: DC

## 2023-09-01 NOTE — Progress Notes (Signed)
 Cardiology Office Note:    Date:  09/01/2023   ID:  Joel Mills, DOB 21-Apr-1981, MRN [id number]  PCP:  Lemar Pyles, NP   [facility] Providers Cardiologist:  None     Referring MD: Lemar Pyles, NP   Chief Complaint  Patient presents with   Shortness of Breath    Patient had two episodes where he woke up and couldn't breath. He couldn't breath in and couldn't breath out. Patient states that he feels alright on today. Patient states that he experiences cramping on his right side underneath his pectoral area.  Meds reviewed.     History of Present Illness:    Joel Mills is a 43 y.o. male with a hx of asthma, anxiety who presents with shortness of breath and chest pain.  Endorse having sudden shortness of breath in his sleep about a month ago.  Also had another episode couple of weeks prior.  Denies smoking, denies any history of heart disease.  Has occasional left-sided chest discomfort and left arm pain not associated with exertion.  Exercise frequently with playing basketball, recently broke his left foot while playing basketball.  Left foot currently in an [employer].  He is not sure if chest discomfort is secondary to anxiety as he has had prior episodes of discomfort during an anxiety attack.  Past Medical History:  Diagnosis Date   Anemia    43 years old   Asthma    Depression     Past Surgical History:  Procedure Laterality Date   ADENOIDECTOMY     ESOPHAGOGASTRODUODENOSCOPY (EGD) WITH PROPOFOL  N/A 01/07/2018   Procedure: ESOPHAGOGASTRODUODENOSCOPY (EGD) WITH PROPOFOL ;  Surgeon: Toledo, Alphonsus Jeans, MD;  Location: [facility] ENDOSCOPY;  Service: Gastroenterology;  Laterality: N/A;   VASECTOMY      Current Medications: Current Meds  Medication Sig   Cholecalciferol (VITAMIN D3) 25 MCG (1000 UT) CAPS Take 1 capsule (1,000 Units total) by mouth daily.   ivabradine (CORLANOR) 7.5 MG TABS tablet Take 2 tablets (15 mg total) by mouth 2 (two) times daily with a  meal. Take 2 tablets two hours prior to cardiac CT   oxyCODONE  (ROXICODONE ) 5 MG immediate release tablet Take 1 tablet (5 mg total) by mouth every 4 (four) hours as needed for up to 6 doses for severe pain (pain score 7-10).     Allergies:   Cyclobenzaprine , Nsaids, and Ibuprofen   Social History   Socioeconomic History   Marital status: Married    Spouse name: Not on file   Number of children: 2   Years of education: Not on file   Highest education level: Not on file  Occupational History   Occupation: Corporate investment banker  Tobacco Use   Smoking status: Former    Current packs/day: 0.00    Types: Cigarettes    Quit date: 11/30/2018    Years since quitting: 4.7   Smokeless tobacco: Never  Vaping Use   Vaping status: Never Used  Substance and Sexual Activity   Alcohol use: Yes    Comment: socially   Drug use: Not Currently    Frequency: 3.0 times per week    Types: Marijuana    Comment: smoke sometimes ( pt uses as sleep aid)   Sexual activity: Yes  Other Topics Concern   Not on file  Social History Narrative   Not on file   Social Drivers of Health   Financial Resource Strain: Not on file  Food Insecurity: Not on file  Transportation  Needs: Not on file  Physical Activity: Not on file  Stress: Not on file  Social Connections: Not on file     Family History: The patient's family history includes Cancer in his sister; Depression in his father and sister; Diabetes in his mother; Hypertension in his father.  ROS:   Please see the history of present illness.     All other systems reviewed and are negative.  EKGs/Labs/Other Studies Reviewed:    The following studies were reviewed today:  EKG Interpretation Date/Time:  Friday Sep 01 2023 09:09:29 EDT Ventricular Rate:  78 PR Interval:  128 QRS Duration:  92 QT Interval:  368 QTC Calculation: 419 R Axis:   63  Text Interpretation: Normal sinus rhythm Right atrial enlargement Confirmed by Constancia Delton  [phone]) on 09/01/2023 9:17:52 AM    Recent Labs: 06/13/2023: ALT 21; BNP 5.5; BUN 11; Creatinine, Ser 0.99; Hemoglobin 15.8; Platelets 189; Potassium 4.5; Sodium 138  Recent Lipid Panel    Component Value Date/Time   CHOL 137 06/21/2022 1620   TRIG 103 06/21/2022 1620   HDL 27 (L) 06/21/2022 1620   LDLCALC 91 06/21/2022 1620     Risk Assessment/Calculations:             Physical Exam:    VS:  BP 106/72   Pulse 78   Ht 6\' 3"  (1.905 m)   Wt 210 lb (95.3 kg)   SpO2 98%   BMI 26.25 kg/m     Wt Readings from Last 3 Encounters:  09/01/23 210 lb (95.3 kg)  08/28/23 210 lb (95.3 kg)  07/17/23 220 lb (99.8 kg)     GEN:  Well nourished, well developed in no acute distress HEENT: Normal NECK: No JVD; No carotid bruits CARDIAC: RRR, no murmurs, rubs, gallops RESPIRATORY:  Clear to auscultation without rales, wheezing or rhonchi  ABDOMEN: Soft, non-tender, non-distended MUSCULOSKELETAL:  No edema; No deformity  SKIN: Warm and dry NEUROLOGIC:  Alert and oriented x 3 PSYCHIATRIC:  Normal affect   ASSESSMENT:    1. Precordial pain   2. SOB (shortness of breath)    PLAN:    In order of problems listed above:  Chest pain, shortness of breath.  Symptoms are not associated with exertion.  Patient is otherwise low risk.  Symptoms have created wateriness, preventing patient going back to sleep.  Obtain echo, get coronary CT to rule out CAD.  Reassure patient if no significant structural abnormalities noted on cardiac testing.  Follow-up after cardiac testing      Medication Adjustments/Labs and Tests Ordered: Current medicines are reviewed at length with the patient today.  Concerns regarding medicines are outlined above.  Orders Placed This Encounter  Procedures   CT CORONARY MORPH W/CTA COR W/SCORE W/CA W/CM &/OR WO/CM   Basic Metabolic Panel (BMET)   EKG 12-Lead   ECHOCARDIOGRAM COMPLETE   Meds ordered this encounter  Medications   ivabradine (CORLANOR) 7.5 MG TABS  tablet    Sig: Take 2 tablets (15 mg total) by mouth 2 (two) times daily with a meal. Take 2 tablets two hours prior to cardiac CT    Dispense:  2 tablet    Refill:  0    Patient Instructions  Medication Instructions:  -Take 2 corlanor tablets 2 hours prior to cardiac CT  *If you need a refill on your cardiac medications before your next appointment, please call your pharmacy*  Lab Work: Your provider would like for you to have following labs drawn today  BMP.   If you have labs (blood work) drawn today and your tests are completely normal, you will receive your results only by: MyChart Message (if you have MyChart) OR A paper copy in the mail If you have any lab test that is abnormal or we need to change your treatment, we will call you to review the results.  Testing/Procedures: Your physician has requested that you have an echocardiogram. Echocardiography is a painless test that uses sound waves to create images of your heart. It provides your doctor with information about the size and shape of your heart and how well your heart's chambers and valves are working.   You may receive an ultrasound enhancing agent through an IV if needed to better visualize your heart during the echo. This procedure takes approximately one hour.  There are no restrictions for this procedure.  This will take place at [address] [facility] [address], [place] [address]  Please note: We ask at that you not bring children with you during ultrasound (echo/ vascular) testing. Due to room size and safety concerns, children are not allowed in the ultrasound rooms during exams. Our front office staff cannot provide observation of children in our lobby area while testing is being conducted. An adult accompanying a patient to their appointment will only be allowed in the ultrasound room at the discretion of the ultrasound technician under special circumstances. We apologize for any inconvenience.      Your cardiac CT will be scheduled at one of the below locations:   [facility] [address] [place], [place] [address] [phone]  OR   [facility] [address] [place], [place] [address] [phone]   If scheduled at [facility] or [facility], please arrive 15 mins early for check-in and test prep.  There is spacious parking and easy access to the radiology department from the [facility] Heart and Vascular entrance. Please enter here and check-in with the desk attendant.   Please follow these instructions carefully (unless otherwise directed):  An IV will be required for this test and Nitroglycerin will be given.  Hold all erectile dysfunction medications at least 3 days (72 hrs) prior to test. (Ie viagra, cialis, sildenafil, tadalafil, etc)   On the Night Before the Test: Be sure to Drink plenty of water. Do not consume any caffeinated/decaffeinated beverages or chocolate 12 hours prior to your test. Do not take any antihistamines 12 hours prior to your test.  On the Day of the Test: Drink plenty of water until 1 hour prior to the test. Do not eat any food 1 hour prior to test. You may take your regular medications prior to the test.  Take metoprolol (Lopressor) two hours prior to test. If you take Furosemide/Hydrochlorothiazide/Spironolactone/Chlorthalidone, please HOLD on the morning of the test. Patients who wear a continuous glucose monitor MUST remove the device prior to scanning.      After the Test: Drink plenty of water. After receiving IV contrast, you may experience a mild flushed feeling. This is normal. On occasion, you may experience a mild rash up to 24 hours after the test. This is not dangerous. If this occurs, you can take Benadryl 25 mg, Zyrtec, Claritin, or Allegra and increase your fluid intake. (Patients taking Tikosyn should  avoid Benadryl, and may take Zyrtec, Claritin, or Allegra) If you experience trouble breathing, this can be serious. If it is severe call 911  IMMEDIATELY. If it is mild, please call our office.  We will call to schedule your test 2-4 weeks out understanding that some insurance companies will need an authorization prior to the service being performed.   For more information and frequently asked questions, please visit our website : [email or web address]  For non-scheduling related questions, please contact the cardiac imaging nurse navigator should you have any questions/concerns: Cardiac Imaging Nurse Navigators Direct Office Dial: [phone]   For scheduling needs, including cancellations and rescheduling, please call [place], [phone].   Follow-Up: At [facility], you and your health needs are our priority.  As part of our continuing mission to provide you with exceptional heart care, our providers are all part of one team.  This team includes your primary Cardiologist (physician) and Advanced Practice Providers or APPs (Physician Assistants and Nurse Practitioners) who all work together to provide you with the care you need, when you need it.  Your next appointment:   3 month(s)  Provider:   You may see Dr. Junnie Olives or one of the following Advanced Practice Providers on your designated Care Team:   Laneta Pintos, NP Gildardo Labrador, PA-C Varney Gentleman, PA-C Cadence [place], PA-C Ronald Cockayne, NP Morey Ar, NP    We recommend signing up for the patient portal called "MyChart".  Sign up information is provided on this After Visit Summary.  MyChart is used to connect with patients for Virtual Visits (Telemedicine).  Patients are able to view lab/test results, encounter notes, upcoming appointments, etc.  Non-urgent messages can be sent to your provider as well.   To learn more about what you can do with MyChart, go to [email or web address].          Signed, Constancia Delton, MD  09/01/2023 12:38 PM    [facility]

## 2023-09-01 NOTE — Patient Instructions (Signed)
 Medication Instructions:  -Take 2 corlanor tablets 2 hours prior to cardiac CT  *If you need a refill on your cardiac medications before your next appointment, please call your pharmacy*  Lab Work: Your provider would like for you to have following labs drawn today BMP.   If you have labs (blood work) drawn today and your tests are completely normal, you will receive your results only by: MyChart Message (if you have MyChart) OR A paper copy in the mail If you have any lab test that is abnormal or we need to change your treatment, we will call you to review the results.  Testing/Procedures: Your physician has requested that you have an echocardiogram. Echocardiography is a painless test that uses sound waves to create images of your heart. It provides your doctor with information about the size and shape of your heart and how well your heart's chambers and valves are working.   You may receive an ultrasound enhancing agent through an IV if needed to better visualize your heart during the echo. This procedure takes approximately one hour.  There are no restrictions for this procedure.  This will take place at [address] [facility] [address]) [address], [place] [address]  Please note: We ask at that you not bring children with you during ultrasound (echo/ vascular) testing. Due to room size and safety concerns, children are not allowed in the ultrasound rooms during exams. Our front office staff cannot provide observation of children in our lobby area while testing is being conducted. An adult accompanying a patient to their appointment will only be allowed in the ultrasound room at the discretion of the ultrasound technician under special circumstances. We apologize for any inconvenience.     Your cardiac CT will be scheduled at one of the below locations:   [facility] [address] [place], [place] [address] [phone]  OR    [facility] [address] [place], [place] [address] [phone]   If scheduled at [facility] or [facility], please arrive 15 mins early for check-in and test prep.  There is spacious parking and easy access to the radiology department from the [facility] Heart and Vascular entrance. Please enter here and check-in with the desk attendant.   Please follow these instructions carefully (unless otherwise directed):  An IV will be required for this test and Nitroglycerin will be given.  Hold all erectile dysfunction medications at least 3 days (72 hrs) prior to test. (Ie viagra, cialis, sildenafil, tadalafil, etc)   On the Night Before the Test: Be sure to Drink plenty of water. Do not consume any caffeinated/decaffeinated beverages or chocolate 12 hours prior to your test. Do not take any antihistamines 12 hours prior to your test.  On the Day of the Test: Drink plenty of water until 1 hour prior to the test. Do not eat any food 1 hour prior to test. You may take your regular medications prior to the test.  Take metoprolol (Lopressor) two hours prior to test. If you take Furosemide/Hydrochlorothiazide/Spironolactone/Chlorthalidone, please HOLD on the morning of the test. Patients who wear a continuous glucose monitor MUST remove the device prior to scanning.      After the Test: Drink plenty of water. After receiving IV contrast, you may experience a mild flushed feeling. This is normal. On occasion, you may experience a mild rash up to 24 hours after the test. This is not dangerous. If  this occurs, you can take Benadryl 25 mg, Zyrtec, Claritin, or Allegra and increase your fluid intake. (Patients taking Tikosyn should avoid Benadryl, and may take Zyrtec, Claritin, or Allegra) If you experience trouble breathing, this can be serious. If it is severe call 911 IMMEDIATELY. If it is mild, please call our office.  We  will call to schedule your test 2-4 weeks out understanding that some insurance companies will need an authorization prior to the service being performed.   For more information and frequently asked questions, please visit our website : [email or web address]  For non-scheduling related questions, please contact the cardiac imaging nurse navigator should you have any questions/concerns: Cardiac Imaging Nurse Navigators Direct Office Dial: [phone]   For scheduling needs, including cancellations and rescheduling, please call [place], [phone].   Follow-Up: At [facility], you and your health needs are our priority.  As part of our continuing mission to provide you with exceptional heart care, our providers are all part of one team.  This team includes your primary Cardiologist (physician) and Advanced Practice Providers or APPs (Physician Assistants and Nurse Practitioners) who all work together to provide you with the care you need, when you need it.  Your next appointment:   3 month(s)  Provider:   You may see Dr. Junnie Olives or one of the following Advanced Practice Providers on your designated Care Team:   Laneta Pintos, NP Gildardo Labrador, PA-C Varney Gentleman, PA-C Cadence [place], PA-C Ronald Cockayne, NP Morey Ar, NP    We recommend signing up for the patient portal called "MyChart".  Sign up information is provided on this After Visit Summary.  MyChart is used to connect with patients for Virtual Visits (Telemedicine).  Patients are able to view lab/test results, encounter notes, upcoming appointments, etc.  Non-urgent messages can be sent to your provider as well.   To learn more about what you can do with MyChart, go to [email or web address].
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
## 2023-09-02 LAB — BASIC METABOLIC PANEL WITH GFR
BUN/Creatinine Ratio: 13 (ref 9–20)
BUN: 13 mg/dL (ref 6–24)
CO2: 22 mmol/L (ref 20–29)
Calcium: 9.7 mg/dL (ref 8.7–10.2)
Chloride: 100 mmol/L (ref 96–106)
Creatinine, Ser: 1.03 mg/dL (ref 0.76–1.27)
Glucose: 85 mg/dL (ref 70–99)
Potassium: 4.5 mmol/L (ref 3.5–5.2)
Sodium: 140 mmol/L (ref 134–144)
eGFR: 93 mL/min/{1.73_m2} (ref >59)

## 2023-09-06 NOTE — Patient Instructions (Signed)
 Living With Sleep Apnea Sleep apnea is a condition that affects your breathing while you're sleeping. Your tongue or the tissue in your throat may block the flow of air while you sleep. You may have shallow breathing or stop breathing for short periods of time. The breaks in breathing interrupt the deep sleep that you need to feel rested. Even if you don't wake up from the gaps in breathing, you may feel tired during the day. People with sleep apnea may snore loudly. You may have a headache in the morning and feel anxious or depressed. How can sleep apnea affect me? Sleep apnea increases your chances of being very tired during the day. This is called daytime fatigue. Sleep apnea can also increase your risk of: Heart attack. Stroke. Obesity. Type 2 diabetes. Heart failure. Irregular heartbeat. High blood pressure. If you are very tired during the day, you may be more likely to: Not do well in school or at work. Fall asleep while driving. Have trouble paying attention. Develop depression or anxiety. Have problems having sex. This is called sexual dysfunction. What actions can I take to manage sleep apnea? Sleep apnea treatment  If you were given a device to open your airway while you sleep, use it only as told by your health care provider. You may be given: An oral appliance. This is a mouthpiece that shifts your lower jaw forward. A continuous positive airway pressure (CPAP) device. This blows air through a mask. A nasal expiratory positive airway pressure (EPAP) device. This has valves that you put into each nostril. A bi-level positive airway pressure (BIPAP) device. This blows air through a mask when you breathe in and breathe out. You may need surgery if other treatments don't work for you. Sleep habits Go to sleep and wake up at the same time every day. This helps set your internal clock for sleeping. If you stay up later than usual on weekends, try to get up in the morning within 2  hours of the time you usually wake up. Try to get at least 7-9 hours of sleep each night. Stop using a computer, tablet, and mobile phone a few hours before bedtime. Do not take long naps during the day. If you nap, limit it to 30 minutes. Have a relaxing bedtime routine. Reading or listening to music may relax you and help you sleep. Use your bedroom only for sleep. Keep your television and computer out of your bedroom. Keep your bedroom cool, dark, and quiet. Use a supportive mattress and pillows. Follow your provider's instructions for other changes to sleep habits. Nutrition Do not eat big meals in the evening. Do not have caffeine in the later part of the day. The effects of caffeine can last for more than 5 hours. Follow your provider's instructions for any changes to what you eat and drink. Lifestyle Do not drink alcohol before bedtime. Alcohol can cause you to fall asleep at first, but then it can cause you to wake up in the middle of the night and have trouble getting back to sleep. Do not smoke, vape, or use nicotine or tobacco. Medicines Take over-the-counter and prescription medicines only as told by your provider. Do not use over-the-counter sleep medicine. You may become dependent on this medicine, and it can make sleep apnea worse. Do not take medicines, such as sedatives and narcotics, unless told to by your provider. Activity Exercise on most days, but avoid exercising in the evening. Exercising near bedtime can interfere with sleeping.  If possible, spend time outside every day. Natural light helps with your internal clock. General information Lose weight if you need to. Stay at a healthy weight. If you are having surgery, make sure to tell your provider that you have sleep apnea. You may need to bring your device with you. Keep all follow-up visits. Your provider will want to check on your condition. Where to find more information National Heart, Lung, and Blood  Institute: [employer] This information is not intended to replace advice given to you by your health care provider. Make sure you discuss any questions you have with your health care provider. Document Revised: 08/10/2022 Document Reviewed: 08/10/2022 Elsevier Patient Education  2024 [employer].

## 2023-09-07 ENCOUNTER — Encounter: Payer: Self-pay | Admitting: Nurse Practitioner

## 2023-09-07 ENCOUNTER — Ambulatory Visit: Admitting: Nurse Practitioner

## 2023-09-07 VITALS — BP 105/70 | HR 73 | Temp 98.0°F | Ht 74.0 in | Wt 208.0 lb

## 2023-09-07 DIAGNOSIS — F419 Anxiety disorder, unspecified: Secondary | ICD-10-CM | POA: Diagnosis not present

## 2023-09-07 DIAGNOSIS — G4733 Obstructive sleep apnea (adult) (pediatric): Secondary | ICD-10-CM | POA: Diagnosis not present

## 2023-09-07 DIAGNOSIS — F32A Depression, unspecified: Secondary | ICD-10-CM

## 2023-09-07 DIAGNOSIS — G8929 Other chronic pain: Secondary | ICD-10-CM

## 2023-09-07 DIAGNOSIS — E7800 Pure hypercholesterolemia, unspecified: Secondary | ICD-10-CM

## 2023-09-07 DIAGNOSIS — M25552 Pain in left hip: Secondary | ICD-10-CM

## 2023-09-07 DIAGNOSIS — M5441 Lumbago with sciatica, right side: Secondary | ICD-10-CM

## 2023-09-07 DIAGNOSIS — M5442 Lumbago with sciatica, left side: Secondary | ICD-10-CM

## 2023-09-07 DIAGNOSIS — R7989 Other specified abnormal findings of blood chemistry: Secondary | ICD-10-CM | POA: Diagnosis not present

## 2023-09-07 NOTE — Assessment & Plan Note (Signed)
Refer to back pain plan of care. 

## 2023-09-07 NOTE — Assessment & Plan Note (Signed)
 Chronic, ongoing. MRI 2020 did not arthritic changes and mild stenosis.  Suspect his current worsening hip pain may be coming from back + back currently more painful due to use of crutches.  Will place referral to ortho for further evaluation and possible PT.  Continue OTC medication regimen for pain.

## 2023-09-07 NOTE — Assessment & Plan Note (Signed)
 Missed recheck of levels in past.  Will recheck today.  Does have ongoing fatigue.  Send to urology as needed.

## 2023-09-07 NOTE — Assessment & Plan Note (Signed)
 New diagnosis.  He prefers not to use CPAP.  Would be interested in trial of mouth piece and will discuss with sleep provider.

## 2023-09-07 NOTE — Progress Notes (Signed)
 BP 105/70   Pulse 73   Temp 98 F (36.7 C) (Oral)   Ht 6\' 2"  (1.88 m)   Wt 208 lb (94.3 kg)   SpO2 98%   BMI 26.71 kg/m    Subjective:    Patient ID: Sahar Primus, male    DOB: November 07, 1980, 43 y.o.   MRN: [id number]  HPI: Uzziah Huser is a 43 y.o. male  Chief Complaint  Patient presents with   Anxiety   Depression   Has been having issues with left hip popping with movements outwards and inwards. This led to muscles spasms in lower back x 1 episodes.  Always at baseline has lower back issues with radiation down left leg to toes. Last MRI in 2022 did note lumbar mild stenosis with disc contacting descending left S1 nerve roots. Has not been ortho.  SLEEP APNEA Had recent sleep study on 07/12/23.  This did return noting mild sleep apnea with recommendation to treat with PAP therapy. History of low testosterone  on labs 2023, missed recheck.  Does endorse fatigue. Sleep apnea status: new diagnosis Duration: chronic Last sleep study: 08/22/23 Treatments attempted: none at present Wakes feeling refreshed:  no Daytime hypersomnolence:  no Fatigue:  yes Insomnia:  yes staying asleep Good sleep hygiene:  yes Difficulty falling asleep:  no Difficulty staying asleep:  yes Snoring bothers bed partner:  no Observed apnea by bed partner: no Obesity:  no Hypertension: no  Pulmonary hypertension:  no Coronary artery disease:  no    06/13/2023    9:00 AM  Results of the Epworth flowsheet  Sitting and reading 2  Watching TV 2  Sitting, inactive in a public place (e.g. a theatre or a meeting) 2  As a passenger in a car for an hour without a break 3  Lying down to rest in the afternoon when circumstances permit 1  Sitting and talking to someone 1  Sitting quietly after a lunch without alcohol 3  In a car, while stopped for a few minutes in traffic 1  Total score 15    DEPRESSION & ANXIETY No current medications. Mood status: stable Satisfied with current treatment?: yes Symptom  severity: mild  Depressed mood: no Anxious mood: no Anhedonia: no Significant weight loss or gain: no Insomnia: yes hard to stay asleep Fatigue: yes Feelings of worthlessness or guilt: no Impaired concentration/indecisiveness: no Suicidal ideations: no Hopelessness: no Crying spells: no    09/07/2023    9:06 AM 06/21/2022    4:35 PM 03/30/2022    2:40 PM 08/13/2020    8:35 AM 08/03/2020    9:49 AM  Depression screen PHQ 2/9  Decreased Interest 1 2 1 3  0  Down, Depressed, Hopeless 0 1 1 3  0  PHQ - 2 Score 1 3 2 6  0  Altered sleeping 3 3 2 3    Tired, decreased energy 2 2 2 3    Change in appetite 1 1 0 1   Feeling bad or failure about yourself  0 0 0 1   Trouble concentrating 0 2 0 2   Moving slowly or fidgety/restless 0 1 0 0   Suicidal thoughts 0 0 0 0   PHQ-9 Score 7 12 6 16    Difficult doing work/chores Somewhat difficult Somewhat difficult Not difficult at all         09/07/2023    9:07 AM 06/21/2022    4:36 PM 03/30/2022    2:40 PM 08/13/2020    8:37 AM  GAD 7 :  Generalized Anxiety Score  Nervous, Anxious, on Edge 1 1 1 3   Control/stop worrying 0 1 0 0  Worry too much - different things 0 0 0 0  Trouble relaxing 2 2 1 3   Restless 1 2 1  0  Easily annoyed or irritable 1 2 1 3   Afraid - awful might happen 0 0 0 0  Total GAD 7 Score 5 8 4 9   Anxiety Difficulty Somewhat difficult Somewhat difficult Not difficult at all    Relevant past medical, surgical, family and social history reviewed and updated as indicated. Interim medical history since our last visit reviewed. Allergies and medications reviewed and updated.  Review of Systems  Constitutional:  Positive for fatigue. Negative for activity change, appetite change, diaphoresis and fever.  Respiratory:  Negative for cough, chest tightness, shortness of breath and wheezing.   Cardiovascular:  Negative for chest pain, palpitations and leg swelling.  Gastrointestinal: Negative.   Musculoskeletal:  Positive for  arthralgias and back pain.  Neurological: Negative.   Psychiatric/Behavioral:  Positive for sleep disturbance. Negative for decreased concentration, self-injury and suicidal ideas. The patient is not nervous/anxious.     Per HPI unless specifically indicated above     Objective:     BP 105/70   Pulse 73   Temp 98 F (36.7 C) (Oral)   Ht 6\' 2"  (1.88 m)   Wt 208 lb (94.3 kg)   SpO2 98%   BMI 26.71 kg/m   Wt Readings from Last 3 Encounters:  09/07/23 208 lb (94.3 kg)  09/01/23 210 lb (95.3 kg)  08/28/23 210 lb (95.3 kg)    Physical Exam Vitals and nursing note reviewed.  Constitutional:      General: He is awake. He is not in acute distress.    Appearance: He is well-developed and well-groomed. He is not ill-appearing or toxic-appearing.  HENT:     Head: Normocephalic.     Right Ear: Hearing and external ear normal.     Left Ear: Hearing and external ear normal.  Eyes:     General: Lids are normal.     Extraocular Movements: Extraocular movements intact.     Conjunctiva/sclera: Conjunctivae normal.  Neck:     Thyroid: No thyromegaly.     Vascular: No carotid bruit.  Cardiovascular:     Rate and Rhythm: Normal rate and regular rhythm.     Heart sounds: Normal heart sounds. No murmur heard.    No gallop.  Pulmonary:     Effort: No accessory muscle usage or respiratory distress.     Breath sounds: Normal breath sounds. No decreased breath sounds, wheezing or rales.  Abdominal:     General: Bowel sounds are normal. There is no distension.     Palpations: Abdomen is soft.     Tenderness: There is no abdominal tenderness.  Musculoskeletal:     Cervical back: Full passive range of motion without pain.     Right lower leg: No edema.     Left lower leg: No edema.     Comments: Left foot in boot due to recent fracture, using crutches.  Lymphadenopathy:     Cervical: No cervical adenopathy.  Skin:    General: Skin is warm.     Capillary Refill: Capillary refill takes  less than 2 seconds.  Neurological:     Mental Status: He is alert and oriented to person, place, and time.     Deep Tendon Reflexes: Reflexes are normal and symmetric.  Reflex Scores:      Brachioradialis reflexes are 2+ on the right side and 2+ on the left side.      Patellar reflexes are 2+ on the right side and 2+ on the left side. Psychiatric:        Attention and Perception: Attention normal.        Mood and Affect: Mood is anxious.        Speech: Speech normal.        Behavior: Behavior normal. Behavior is cooperative.        Thought Content: Thought content normal.     Results for orders placed or performed in visit on 09/01/23  Basic Metabolic Panel (BMET)   Collection Time: 09/01/23  9:48 AM  Result Value Ref Range   Glucose 85 70 - 99 mg/dL   BUN 13 6 - 24 mg/dL   Creatinine, Ser 1.61 0.76 - 1.27 mg/dL   eGFR 93 >09 UE/AVW/0.98   BUN/Creatinine Ratio 13 9 - 20   Sodium 140 134 - 144 mmol/L   Potassium 4.5 3.5 - 5.2 mmol/L   Chloride 100 96 - 106 mmol/L   CO2 22 20 - 29 mmol/L   Calcium 9.7 8.7 - 10.2 mg/dL      Assessment & Plan:   Problem List Items Addressed This Visit       Respiratory   OSA (obstructive sleep apnea)   New diagnosis.  He prefers not to use CPAP.  Would be interested in trial of mouth piece and will discuss with sleep provider.        Nervous and Auditory   Chronic bilateral low back pain with bilateral sciatica   Chronic, ongoing. MRI 2020 did not arthritic changes and mild stenosis.  Suspect his current worsening hip pain may be coming from back + back currently more painful due to use of crutches.  Will place referral to ortho for further evaluation and possible PT.  Continue OTC medication regimen for pain.      Relevant Orders   Ambulatory referral to Orthopedic Surgery     Other   Low testosterone  in male - Primary   Missed recheck of levels in past.  Will recheck today.  Does have ongoing fatigue.  Send to urology as  needed.      Relevant Orders   Testosterone , free, total([facility])   Chronic left hip pain   Refer to back pain plan of care.      Relevant Orders   Ambulatory referral to Orthopedic Surgery   Anxiety and depression   Stable without medication.  Denies SI/HI.  Start medication in future as needed.      Relevant Orders   TSH   Other Visit Diagnoses       Elevated low density lipoprotein (LDL) cholesterol level       Check lipid panel today.   Relevant Orders   Lipid Panel w/o Chol/HDL Ratio       Follow up plan: Return in about 1 year (around 09/06/2024) for Annual Physical.

## 2023-09-07 NOTE — Telephone Encounter (Signed)
-----   Message from Alden Dohmeier sent at 09/01/2023  4:09 PM EDT -----  I recommend to treat this mild but REM- sleep -dependent form of obstructive sleep apnea with positive airway pressure therapy while encouraging the patient to lose weight as well. There was a reported  connection between weight gain and beginning to snore more loudly and frequently -so it is likely that weight loss will help to reduce the symptoms.   In the meantime, PAP therapy with an autotitration CPAP device should be used and I have ordered a pressure setting between 6 and 16 cm water pressure with 3 cm EPR, heated humidification and an interface of patient's choice and comfort to be fitted by the DME/durable medical equipment company.

## 2023-09-07 NOTE — Telephone Encounter (Signed)
 I called pt. I advised pt that Dr. Albertina Hugger reviewed their sleep study results and found that pt has OSA. Dr. Dohmeier recommends that pt treat this with auto CPAP. I reviewed PAP compliance expectations with the pt. Pt is agreeable to starting a CPAP. I advised pt that an order will be sent to a DME, [facility], and [facility] will call the pt within about one week after they file with the pt's insurance. [facility] will show the pt how to use the machine, fit for masks, and troubleshoot the CPAP if needed. A follow up appt was made for insurance purposes with Johny Nap, NP on 11/21/23. Pt verbalized understanding to arrive 15 minutes early and bring their CPAP. Pt verbalized understanding of results. Pt had no questions at this time but was encouraged to call back if questions arise. I have sent the order to [facility] and have received confirmation that they have received the order.

## 2023-09-07 NOTE — Assessment & Plan Note (Signed)
 Stable without medication.  Denies SI/HI.  Start medication in future as needed.

## 2023-09-09 ENCOUNTER — Encounter: Payer: Self-pay | Admitting: Nurse Practitioner

## 2023-09-09 LAB — LIPID PANEL W/O CHOL/HDL RATIO
Cholesterol, Total: 155 mg/dL (ref 100–199)
HDL: 24 mg/dL — ABNORMAL LOW (ref >39)
LDL Chol Calc (NIH): 85 mg/dL (ref 0–99)
Triglycerides: 278 mg/dL — ABNORMAL HIGH (ref 0–149)
VLDL Cholesterol Cal: 46 mg/dL — ABNORMAL HIGH (ref 5–40)

## 2023-09-09 LAB — TESTOSTERONE, FREE, TOTAL, SHBG
Sex Hormone Binding: 19.7 nmol/L (ref 16.5–55.9)
Testosterone, Free: 6.2 pg/mL — ABNORMAL LOW (ref 6.8–21.5)
Testosterone: 377 ng/dL (ref 264–916)

## 2023-09-09 LAB — TSH: TSH: 1.44 u[IU]/mL (ref 0.450–4.500)

## 2023-09-11 ENCOUNTER — Telehealth: Payer: Self-pay | Admitting: Nurse Practitioner

## 2023-09-11 NOTE — Telephone Encounter (Signed)
 Copied from CRM [id number]. Topic: Referral - Question >> Sep 08, 2023  4:21 PM DeAngela L wrote: Reason for CRM: Patient called back to inform the office the referral for [facility] [address] [place] [address] [phone]  is not a good fit for him the office had a billing concern and will not schedule or see the patient and he would like a different provider [facility] is a place he has been before  Patient number [phone] (M)

## 2023-09-11 NOTE — Telephone Encounter (Signed)
 Can Ortho referral be sent elsewhere for the patient?

## 2023-09-11 NOTE — Telephone Encounter (Signed)
 Called and notified patient that referral has been sent to [facility] as requested.

## 2023-09-14 ENCOUNTER — Telehealth: Payer: Self-pay | Admitting: Podiatry

## 2023-09-14 NOTE — Telephone Encounter (Signed)
 Patient is requesting a scooter due to now experiencing back  pain and he stated it's due to fractured foot. Patient contact telephone number, [phone]

## 2023-09-20 ENCOUNTER — Telehealth (HOSPITAL_COMMUNITY): Payer: Self-pay | Admitting: *Deleted

## 2023-09-20 NOTE — Telephone Encounter (Signed)
Reaching out to patient to offer assistance regarding upcoming cardiac imaging study; pt verbalizes understanding of appt date/time, parking situation and where to check in, pre-test NPO status and medications ordered, and verified current allergies; name and call back number provided for further questions should they arise  Gordy Clement RN Navigator Cardiac Imaging [facility] [phone] office [phone] cell  Patient to take 71m ivabradine two hours prior to his cardiac CT scan.

## 2023-09-21 ENCOUNTER — Ambulatory Visit
Admission: RE | Admit: 2023-09-21 | Discharge: 2023-09-21 | Disposition: A | Discharge status: Home / Self Care | Source: Ambulatory Visit | Attending: Cardiology | Admitting: Cardiology

## 2023-09-21 DIAGNOSIS — R072 Precordial pain: Secondary | ICD-10-CM | POA: Insufficient documentation

## 2023-09-21 MED ORDER — METOPROLOL TARTRATE 5 MG/5ML IV SOLN: 10.0000 mg | INTRAVENOUS | Status: DC | PRN

## 2023-09-21 MED ORDER — NITROGLYCERIN 0.4 MG SL SUBL
0.8000 mg | SUBLINGUAL_TABLET | Freq: Once | SUBLINGUAL | Status: AC
  Administered 2023-09-21: 0.8 mg via SUBLINGUAL

## 2023-09-21 MED ORDER — DILTIAZEM HCL 25 MG/5ML IV SOLN: 10.0000 mg | INTRAVENOUS | Status: DC | PRN

## 2023-09-21 MED ORDER — IOHEXOL 350 MG/ML SOLN
80.0000 mL | Freq: Once | INTRAVENOUS | Status: AC | PRN
  Administered 2023-09-21: 80 mL via INTRAVENOUS

## 2023-09-21 NOTE — Progress Notes (Signed)
Patient tolerated procedure well. W/C to lobby.  Ambulate w/o difficulty. Denies light headedness or being dizzy. Encouraged to drink extra water today and reasoning explained. Verbalized understanding. All questions answered. ABC intact. No further needs. Discharge from procedure area w/o issues.

## 2023-09-25 ENCOUNTER — Ambulatory Visit: Payer: Self-pay | Admitting: Cardiology

## 2023-09-26 ENCOUNTER — Telehealth: Payer: Self-pay

## 2023-09-26 ENCOUNTER — Ambulatory Visit
Admission: RE | Admit: 2023-09-26 | Discharge: 2023-09-26 | Disposition: A | Discharge status: Home / Self Care | Source: Ambulatory Visit | Attending: Orthopedic Surgery | Admitting: Orthopedic Surgery

## 2023-09-26 ENCOUNTER — Ambulatory Visit
Admission: RE | Admit: 2023-09-26 | Discharge: 2023-09-26 | Disposition: A | Discharge status: Home / Self Care | Attending: Orthopedic Surgery | Admitting: Orthopedic Surgery

## 2023-09-26 DIAGNOSIS — G8929 Other chronic pain: Secondary | ICD-10-CM | POA: Insufficient documentation

## 2023-09-26 DIAGNOSIS — M545 Low back pain, unspecified: Secondary | ICD-10-CM | POA: Diagnosis present

## 2023-09-26 NOTE — Telephone Encounter (Signed)
 Spoke to patient he was given the address for xrays and he states he will go   orders are In

## 2023-09-27 ENCOUNTER — Ambulatory Visit (INDEPENDENT_AMBULATORY_CARE_PROVIDER_SITE_OTHER): Admitting: Orthopedic Surgery

## 2023-09-27 DIAGNOSIS — G8929 Other chronic pain: Secondary | ICD-10-CM

## 2023-09-27 DIAGNOSIS — M545 Low back pain, unspecified: Secondary | ICD-10-CM

## 2023-09-29 ENCOUNTER — Encounter: Payer: Self-pay | Admitting: Orthopedic Surgery

## 2023-09-29 NOTE — Progress Notes (Signed)
 New Patient Visit  Assessment: Joel Mills is a 43 y.o. male with the following: Back pain  Plan: Joel Mills is having lower back pain, primarily on the left side.  He does have a history of sciatica, but this is not consistent with previous pain.  He localizes the pain more in the region of the SI joint.  He has a history of bleeding ulcers, and is not interested in taking NSAIDs.  He is using a walking boot, and the rollator secondary to a foot fracture.  This could be exacerbating his pain recently.  We reviewed radiographs in clinic today, and there are no concerning findings on the imaging.  Provided reassurance.  He states he will try and increase his level of activity, in hopes that this will improve his pain.  If he has further issues, he can return to clinic.  We can consider a referral for injections.  He states understanding.  Follow-up: No follow-ups on file.  Subjective:  Chief Complaint  Patient presents with   Hip Pain    Left hip pain for 6 months no injuries the pain is mostly when he is laying down and changing positions   he has catches in the hip and a pop sound     History of Present Illness: Joel Mills is a 43 y.o. male who has been referred by Jolene Cannady, NP for evaluation of left hip pain.  He states he has had pain in the left hip area for the past 6 months.  No specific injury.  He states he has had sciatica in the past, and this is not similar type pain.  He notes that the pain is in the posterior aspect of the left hip, more medial than his previous sciatica flares.  He has a history of bleeding ulcers, so avoids NSAIDs.  Pain gets worse when he is laying down.  He does notice some catching sensations in the area of the hip, and these can be painful.  He recently sustained a foot fracture, and is wearing a boot.  He is also using a knee rollator to assist with ambulation.   Review of Systems: No fevers or chills No numbness or tingling No chest pain No  shortness of breath No bowel or bladder dysfunction No GI distress No headaches   Medical History:  Past Medical History:  Diagnosis Date   Anemia    43 years old   Asthma    Depression     Past Surgical History:  Procedure Laterality Date   ADENOIDECTOMY     ESOPHAGOGASTRODUODENOSCOPY (EGD) WITH PROPOFOL  N/A 01/07/2018   Procedure: ESOPHAGOGASTRODUODENOSCOPY (EGD) WITH PROPOFOL ;  Surgeon: Toledo, Alphonsus Jeans, MD;  Location: [facility] ENDOSCOPY;  Service: Gastroenterology;  Laterality: N/A;   VASECTOMY      Family History  Problem Relation Age of Onset   Diabetes Mother    Hypertension Father    Depression Father    Cancer Sister    Depression Sister    Social History   Tobacco Use   Smoking status: Former    Current packs/day: 0.00    Types: Cigarettes    Quit date: 11/30/2018    Years since quitting: 4.8   Smokeless tobacco: Never  Vaping Use   Vaping status: Never Used  Substance Use Topics   Alcohol use: Not Currently   Drug use: Not Currently    Types: Marijuana    Allergies  Allergen Reactions   Cyclobenzaprine  Other (See Comments)    Mood  swings per patient   Nsaids Hives   Ibuprofen     Current Meds  Medication Sig   Cholecalciferol (VITAMIN D3) 25 MCG (1000 UT) CAPS Take 1 capsule (1,000 Units total) by mouth daily.    Objective: There were no vitals taken for this visit.  Physical Exam:  General: Alert and oriented. and No acute distress. Gait: Ambulates with the assistance of a walker.  Knee rollator, and a walking boot.  He has tenderness in the lower back, in the area of the SI joint.  No tenderness within the buttock.  Negative straight leg raise on the left.  He has good lower body strength.  Sensation intact in the lower extremity.  IMAGING: I personally ordered and reviewed the following images   AP pelvis and lumbar spine x-rays were obtained in clinic today.  No acute injuries are noted.  Minimal degenerative changes.  No  anterolisthesis on the spine x-rays.  No evidence of arthritis in either hip.   New Medications:  No orders of the defined types were placed in this encounter.     Tonita Frater, MD  09/29/2023 12:27 PM

## 2023-10-09 ENCOUNTER — Ambulatory Visit (INDEPENDENT_AMBULATORY_CARE_PROVIDER_SITE_OTHER): Admitting: Podiatry

## 2023-10-09 ENCOUNTER — Encounter: Payer: Self-pay | Admitting: Podiatry

## 2023-10-09 ENCOUNTER — Ambulatory Visit (INDEPENDENT_AMBULATORY_CARE_PROVIDER_SITE_OTHER)

## 2023-10-09 DIAGNOSIS — M84375D Stress fracture, left foot, subsequent encounter for fracture with routine healing: Secondary | ICD-10-CM

## 2023-10-09 NOTE — Progress Notes (Unsigned)
 Chief Complaint  Patient presents with   Fracture    Recheck for left fracture. He is wearing a boot, he did use crutches for 3 weeks but is now using a scooter, but is allowing himself short distance steps. He has been doing foot stretches as well. Pain is better then it has been.  Not diabetic and no anti coag.     HPI: 43 y.o. male presents today following up for left foot metatarsal stress fracture.  He is little over 5 weeks from the injury.  Reports that he has been wearing the cam boot.  Does report walking short distances.  Does present today without crutches.  Does have a knee scooter at home.  Pain well-controlled.  He has been taking vitamin D  supplementation.  Past Medical History:  Diagnosis Date   Anemia    43 years old   Asthma    Depression     Past Surgical History:  Procedure Laterality Date   ADENOIDECTOMY     ESOPHAGOGASTRODUODENOSCOPY (EGD) WITH PROPOFOL  N/A 01/07/2018   Procedure: ESOPHAGOGASTRODUODENOSCOPY (EGD) WITH PROPOFOL ;  Surgeon: Toledo, Alphonsus Jeans, MD;  Location: [facility] ENDOSCOPY;  Service: Gastroenterology;  Laterality: N/A;   VASECTOMY      Allergies  Allergen Reactions   Cyclobenzaprine  Other (See Comments)    Mood swings per patient   Nsaids Hives   Ibuprofen     ROS denies any nausea, vomiting, fever, chills, chest pain, shortness of breath   Physical Exam: There were no vitals filed for this visit.  General: The patient is alert and oriented x3 in no acute distress.  Dermatology: Skin is warm, dry and supple bilateral lower extremities. Interspaces are clear of maceration and debris.  No ecchymosis noted  Vascular: Palpable pedal pulses bilaterally. Capillary refill within normal limits.  Edema present left foot.  No erythema or calor.  Neurological: Light touch sensation grossly intact bilateral feet.   Musculoskeletal Exam: Decreased pain on palpation of left forefoot.  Radiographic Exam: 10/09/2023 3 views left foot  weightbearing Stress fracture appreciated third metatarsal neck region.  There is some resorption of the fracture site with bone callus noted.  Some trabeculation noted across the fracture site.  Alignment is maintained.  Assessment/Plan of Care: 1. Stress fracture of left foot with routine healing, subsequent encounter      No orders of the defined types were placed in this encounter.  None  Discussed clinical findings with patient today.  Reviewed images with patient.  Emphasized need for compliance with weightbearing restrictions. At this point he may transfer using heel, ambulate short distances within house favoring the heel.  Continue use of the crutches or knee scooter.  Continue vitamin D  supplementation.  Avoid regular NSAID use.  Nonweightbearing in cam boot. Short course of oxycodone  prescribed for acute pain.  Following this he may take Tylenol .  Advised against regular NSAID use as this could slow down bone healing.  Follow-up in 3 weeks for repeat radiographs.  Lucy Boardman L. Lunda Salines, AACFAS [facility]     [address] [place], [place] [address]                Office [phone]  Fax [phone]

## 2023-10-16 ENCOUNTER — Ambulatory Visit: Attending: Cardiology

## 2023-10-16 DIAGNOSIS — R072 Precordial pain: Secondary | ICD-10-CM | POA: Insufficient documentation

## 2023-10-16 LAB — ECHOCARDIOGRAM COMPLETE
AR max vel: 3.44 cm2
AV Area VTI: 3.19 cm2
AV Area mean vel: 3.04 cm2
AV Mean grad: 2 mmHg
AV Peak grad: 4.2 mmHg
Ao pk vel: 1.03 m/s
Area-P 1/2: 2.83 cm2
S' Lateral: 3.21 cm

## 2023-10-26 ENCOUNTER — Encounter: Payer: Self-pay | Admitting: Podiatry

## 2023-10-26 ENCOUNTER — Ambulatory Visit (INDEPENDENT_AMBULATORY_CARE_PROVIDER_SITE_OTHER)

## 2023-10-26 ENCOUNTER — Ambulatory Visit (INDEPENDENT_AMBULATORY_CARE_PROVIDER_SITE_OTHER): Admitting: Podiatry

## 2023-10-26 DIAGNOSIS — M84375D Stress fracture, left foot, subsequent encounter for fracture with routine healing: Secondary | ICD-10-CM

## 2023-10-26 NOTE — Progress Notes (Signed)
       Chief Complaint  Patient presents with   Routine Post Op    Patient states that he has had a little soreness on the bottom of his left foot, and that his foot hurts more with the boot on. Everything else has been fine     HPI: 43 y.o. male presents today following up for left foot metatarsal stress fracture.  He is little over 7 weeks from the injury.  Reports that he has been wearing the cam boot.  He denies significant pain.  Does report some soreness from the boot.  Past Medical History:  Diagnosis Date   Anemia    43 years old   Asthma    Depression     Past Surgical History:  Procedure Laterality Date   ADENOIDECTOMY     ESOPHAGOGASTRODUODENOSCOPY (EGD) WITH PROPOFOL  N/A 01/07/2018   Procedure: ESOPHAGOGASTRODUODENOSCOPY (EGD) WITH PROPOFOL ;  Surgeon: Toledo, Ladell POUR, MD;  Location: [facility] ENDOSCOPY;  Service: Gastroenterology;  Laterality: N/A;   VASECTOMY      Allergies  Allergen Reactions   Cyclobenzaprine  Other (See Comments)    Mood swings per patient   Nsaids Hives   Ibuprofen     ROS denies any nausea, vomiting, fever, chills, chest pain, shortness of breath   Physical Exam: There were no vitals filed for this visit.  General: The patient is alert and oriented x3 in no acute distress.  Dermatology: Skin is warm, dry and supple bilateral lower extremities. Interspaces are clear of maceration and debris.  No ecchymosis noted  Vascular: Palpable pedal pulses bilaterally. Capillary refill within normal limits.  Edema present left foot.  No erythema or calor.  Neurological: Light touch sensation grossly intact bilateral feet.   Musculoskeletal Exam: Decreased pain on palpation of left forefoot.  Radiographic Exam: 10/26/2023 3 views left foot weightbearing Site of stress fracture left third metatarsal neck region there is trabeculation across the fracture site.  Some bone callus present.  Fracture line faintly visible at this point.  Signs of good bone  healing noted.  Assessment/Plan of Care: 1. Stress fracture of left foot with routine healing, subsequent encounter      No orders of the defined types were placed in this encounter.  DG FOOT COMPLETE LEFT  Discussed clinical findings with patient today.  Reviewed images with patient.  Has had good progression of fracture healing.  Advised patient that he can progress to weightbearing as tolerated in regular shoes out of the boot over the next 1 to 2 weeks.  Utilize 30-minute rule.  Advised that he avoid high-impact activity for at least the next 2 to 4 weeks.  Follow-up as needed if symptoms recur or worsen.  Vickki Igou L. Lamount MAUL, AACFAS [facility]     [address] [place], [place] [address]                Office [phone]  Fax [phone]

## 2023-10-29 ENCOUNTER — Encounter: Payer: Self-pay | Admitting: Podiatry

## 2023-11-02 ENCOUNTER — Telehealth: Payer: Self-pay | Admitting: Adult Health

## 2023-11-02 NOTE — Telephone Encounter (Signed)
 Pt was scheduled for his initial CPAP visit on (01-03-24)  Pt was informed to bring  machine and power cord to appt ,  DME: [facility] Phone:[phone] [phone]  Equipment Issued :[facility] (10-26-2023)

## 2023-11-21 ENCOUNTER — Encounter: Admitting: Adult Health

## 2023-12-04 ENCOUNTER — Ambulatory Visit: Attending: Cardiology | Admitting: Cardiology

## 2024-01-03 ENCOUNTER — Encounter: Admitting: Adult Health

## 2024-01-04 ENCOUNTER — Ambulatory Visit: Admitting: Neurology

## 2024-01-04 ENCOUNTER — Encounter: Payer: Self-pay | Admitting: Neurology

## 2024-01-04 VITALS — BP 131/84 | HR 92 | Ht 74.0 in | Wt 217.0 lb

## 2024-01-04 DIAGNOSIS — G4733 Obstructive sleep apnea (adult) (pediatric): Secondary | ICD-10-CM
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
## 2024-01-04 NOTE — Patient Instructions (Signed)
 Modafinil Tablets What is this medication? MODAFINIL (moe DAF i nil) treats sleep disorders, such as narcolepsy, obstructive sleep apnea, and shift work disorder. It works by promoting wakefulness. It belongs to a group of medications called stimulants. This medicine may be used for other purposes; ask your health care provider or pharmacist if you have questions. COMMON BRAND NAME(S): Provigil What should I tell my care team before I take this medication? They need to know if you have any of these conditions: Kidney disease Liver disease Mental health conditions An unusual or allergic reaction to modafinil, other medications, foods, dyes, or preservatives Pregnant or trying to get pregnant Breast-feeding How should I use this medication? Take this medication by mouth with water. Take it as directed on the prescription label at the same time every day. You can take it with or without food. If it upsets your stomach, take it with food. Keep taking it unless your care team tells you to stop. A special MedGuide will be given to you by the pharmacist with each prescription and refill. Be sure to read this information carefully each time. Talk to your care team about the use of this medication in children. Special care may be needed. Overdosage: If you think you have taken too much of this medicine contact a poison control center or emergency room at once. NOTE: This medicine is only for you. Do not share this medicine with others. What if I miss a dose? If you miss a dose, take it as soon as you can. If it is almost time for your next dose, take only that dose. Do not take double or extra doses. What may interact with this medication? Do not take this medication with any of the following: Amphetamine or dextroamphetamine Dexmethylphenidate or methylphenidate MAOIs, such as Marplan, Nardil, and Parnate Pemoline Procarbazine This medication may also interact with the following: Antifungal  medications, such as itraconazole or ketoconazole Barbiturates, such as phenobarbital Carbamazepine Cyclosporine Diazepam Estrogen or progestin hormones Medications for mental health conditions Phenytoin Propranolol Triazolam Warfarin This list may not describe all possible interactions. Give your health care provider a list of all the medicines, herbs, non-prescription drugs, or dietary supplements you use. Also tell them if you smoke, drink alcohol, or use illegal drugs. Some items may interact with your medicine. What should I watch for while using this medication? Visit your care team for regular checks on your progress. It may be some time before you see the benefit from this medication. This medication may affect your coordination, reaction time, or judgment. Do not drive or operate machinery until you know how this medication affects you. Sit up or stand slowly to reduce the risk of dizzy or fainting spells. Drinking alcohol with this medication can increase the risk of these side effects. This medication may cause serious skin reactions. They can happen weeks to months after starting the medication. Contact your care team right away if you notice fevers or flu-like symptoms with a rash. The rash may be red or purple and then turn into blisters or peeling of the skin. You may also notice a red rash with swelling of the face, lips, or lymph nodes in your neck or under your arms. Estrogen and progestin hormones may not work as well while you are taking this medication. Your care team can help you find the contraceptive option that works for you. It is unknown if the effects of this medication will be increased by the use of caffeine. Caffeine is  found in many foods, beverages, and medications. Ask your care team if you should limit or change your intake of caffeine-containing products while on this medication. What side effects may I notice from receiving this medication? Side effects that  you should report to your care team as soon as possible: Allergic reactions or angioedema--skin rash, itching or hives, swelling of the face, eyes, lips, tongue, arms, or legs, trouble swallowing or breathing Increase in blood pressure Mood and behavior changes--anxiety, nervousness, confusion, hallucinations, irritability, hostility, thoughts of suicide or self-harm, worsening mood, feelings of depression Rash, fever, and swollen lymph nodes Redness, blistering, peeling, or loosening of the skin, including inside the mouth Side effects that usually do not require medical attention (report to your care team if they continue or are bothersome): Anxiety, nervousness Dizziness Headache Nausea Trouble sleeping This list may not describe all possible side effects. Call your doctor for medical advice about side effects. You may report side effects to FDA at 1-800-FDA-1088. Where should I keep my medication? Keep out of the reach of children and pets. This medication can be abused. Keep it in a safe place to protect it from theft. Do not share it with anyone. It is only for you. Selling or giving away this medication is dangerous and against the law. Store at room temperature between 20 and 25 degrees C (68 and 77 degrees F). Get rid of any unused medication after the expiration date. This medication may cause harm and death if it is taken by other adults, children, or pets. It is important to get rid of the medication as soon as you no longer need it or it is expired. You can do this in two ways: Take the medication to a medication take-back program. Check with your pharmacy or law enforcement to find a location. If you cannot return the medication, check the label or package insert to see if the medication should be thrown out in the garbage or flushed down the toilet. If you are not sure, ask your care team. If it is safe to put it in the trash, take the medication out of the container. Mix the  medication with cat litter, dirt, coffee grounds, or other unwanted substance. Seal the mixture in a bag or container. Put it in the trash. NOTE: This sheet is a summary. It may not cover all possible information. If you have questions about this medicine, talk to your doctor, pharmacist, or health care provider.  2024 Elsevier/Gold Standard (2022-05-27 00:00:00)   Hypersomnia Hypersomnia is a condition in which a person feels very tired during the day even though the person gets plenty of sleep at night. A person with this condition may take naps during the day and may find it very difficult to wake up from sleep. Hypersomnia may affect a person's ability to think, concentrate, drive, or remember things. What are the causes? The cause of this condition may not be known. Possible causes include: Taking certain medicines. Using drugs or alcohol. Sleep disorders, such as narcolepsy and sleep apnea. Injury to the head, brain, or spinal cord. Tumors. Certain medical conditions. These include: Depression. Diabetes. Gastroesophageal reflux disease (GERD). An underactive thyroid gland (hypothyroidism). What are the signs or symptoms? The main symptoms of hypersomnia include: Feeling very tired throughout the day, regardless of how much sleep you got the night before. Having trouble waking up. Others may find it difficult to wake you up when you are sleeping. Sleeping for longer and longer periods at a time.  Taking naps throughout the day. Other symptoms may include: Feeling restless, anxious, or annoyed. Lacking energy. Having trouble with: Remembering. Speaking. Thinking. Loss of appetite. Seeing, hearing, tasting, smelling, or feeling things that are not real (hallucinations). How is this diagnosed? This condition may be diagnosed based on: Your symptoms and medical history. Your sleeping habits. Your health care provider may ask you to write down your sleeping habits in a daily  sleep log, along with any symptoms you have. A series of tests that are done while you sleep (sleep study or polysomnogram). A test that measures how quickly you can fall asleep during the day (daytime nap study or multiple sleep latency test). How is this treated? This condition may be treated by: Following a regular sleep routine. Making lifestyle changes, such as changing your eating habits, getting regular exercise, and avoiding alcohol or caffeinated beverages. Taking medicines to make you more alert (stimulants) during the day. Treating any underlying medical causes of hypersomnia. Follow these instructions at home: Sleep habits Stick to a routine that includes going to bed and waking up at the same times every day and night. Practice a relaxing bedtime routine. This may include reading, meditation, deep breathing, or taking a warm bath before going to sleep. Exercise regularly as told by your health care provider. However, avoid exercising in the hours right before bedtime. Keep your sleep environment at a cooler temperature, darkened, and quiet. Sleep with pillows and a mattress that are comfortable and supportive. Schedule short 20-minute naps for when you feel sleepiest during the day. Talk with your employer or teachers about your hypersomnia. If possible, adjust your schedule so that: You have a regular daytime work schedule. You can take a scheduled nap during the day. You do not have to work or be active at night. Do not eat a heavy meal for a few hours before bedtime. Eat your meals at about the same times every day. Safety  Do not drive or use machinery if you are sleepy. Ask your health care provider if it is safe for you to drive. Wear a life jacket when swimming or spending time near water. General instructions  Take over-the-counter and prescription medicines only as told by your health care provider. This includes supplements. Avoid drinking alcohol or caffeinated  beverages. Keep a sleep log that will help your health care provider manage your condition. This may include information about: What time you go to bed each night. How often you wake up at night. How many hours you sleep at night. How often and for how long you nap during the day. Any observations from others, such as leg movements during sleep, sleep walking, or snoring. Keep all follow-up visits. This is important. Contact a health care provider if: You have new symptoms. Your symptoms get worse. Get help right away if: You have thoughts about hurting yourself or someone else. Get help right away if you feel like you may hurt yourself or others, or have thoughts about taking your own life. Go to your nearest emergency room or: Call 911. Call the National Suicide Prevention Lifeline at 772-381-8571 or 988. This is open 24 hours a day. Text the Crisis Text Line at 470-793-2943. Summary Hypersomnia refers to a condition in which you feel very tired during the day even though you get plenty of sleep at night. A person with this condition may take naps during the day and may find it very difficult to wake up from sleep. Hypersomnia may affect a  person's ability to think, concentrate, drive, or remember things. Treatment may include a regular sleep routine and making some lifestyle changes. This information is not intended to replace advice given to you by your health care provider. Make sure you discuss any questions you have with your health care provider. Document Revised: 03/29/2021 Document Reviewed: 03/29/2021 Elsevier Patient Education  2024 [employer].

## 2024-02-26 ENCOUNTER — Ambulatory Visit: Admission: EM | Admit: 2024-02-26 | Discharge: 2024-02-26 | Disposition: A | Discharge status: Home / Self Care

## 2024-02-26 ENCOUNTER — Ambulatory Visit: Admitting: Podiatry

## 2024-02-26 VITALS — Ht 74.0 in | Wt 217.0 lb

## 2024-02-26 DIAGNOSIS — B353 Tinea pedis: Secondary | ICD-10-CM

## 2024-02-26 DIAGNOSIS — M79671 Pain in right foot: Secondary | ICD-10-CM | POA: Diagnosis not present

## 2024-02-26 DIAGNOSIS — M79672 Pain in left foot: Secondary | ICD-10-CM

## 2024-02-26 MED ORDER — DRYSOL 20 % EX SOLN: Freq: Two times a day (BID) | CUTANEOUS | Status: DC

## 2024-02-26 MED ORDER — CLOTRIMAZOLE-BETAMETHASONE 1-0.05 % EX CREA: 1.0000 | TOPICAL_CREAM | Freq: Two times a day (BID) | CUTANEOUS | 0 refills | Status: DC

## 2024-02-26 MED ORDER — FLUCONAZOLE 150 MG PO TABS: 150.0000 mg | ORAL_TABLET | Freq: Once | ORAL | 0 refills | Status: AC

## 2024-02-26 NOTE — Discharge Instructions (Addendum)
 Rest and keep your feet elevated.  Take Tylenol  as needed.  Follow-up with a podiatrist such as the one listed below.    Or   [facility] [address] [place], [place] [address] Phone: [phone]

## 2024-02-26 NOTE — Progress Notes (Signed)
 Subjective:  Patient ID: Joel Mills, male    DOB: Jan 18, 1981,  MRN: [id number]  Chief Complaint  Patient presents with   Foot Pain    RM 7 Patient is here for bilateral foot pain and discoloration. There is a white patch on the bottom of the feet spreading towards the bottom of the toes, area is tender to the touch (possible lightly macerated skin). Pt states excessive sweating of feet.    Discussed the use of AI scribe software for clinical note transcription with the patient, who gave verbal consent to proceed.  History of Present Illness Joel Mills is a 43 year old male who presents with painful red spots on both feet. He was referred by urgent care for further evaluation by a podiatrist.  Approximately three weeks ago, he noticed the onset of painful red spots on both feet. Initially, the spots were about the size of a dime or a nickel and have progressively worsened in pain, particularly when walking. The pain is described as intense with increased sensitivity to pressure.  He has increased sweating of his feet, changing his socks once or twice daily without relief. He rotates between two to three pairs of shoes to avoid wearing the same pair daily, suspecting moisture might be contributing to his symptoms. Despite these efforts, there has been no improvement.  No itching or peeling skin is present, but there is tenderness in the middle part of his foot and a sensation of tightness on the underside. No pain is experienced when moving his foot up and down, except for a stretching sensation.  Recently, he has been working longer hours, resulting in wearing shoes for extended periods. He purchased nearly new shoes from a yard sale about two months ago, which he suspects might have contributed to his condition. He alternates between boots and tennis shoes to allow them to air out.  No known exposure to chemicals or poison ivy.      Objective:    Physical Exam VASCULAR: DP and PT  pulse palpable. Foot is warm and well-perfused. Capillary fill time is brisk. DERMATOLOGIC: Erythematous rash on plantar forefoot with tenderness and macerated skin. No pitting or peeling skin. Normal skin turgor, texture, and temperature. No open lesions or ulcerations. NEUROLOGIC: Normal sensation to light touch and pressure. No paresthesias on examination. ORTHOPEDIC: Smooth pain-free range of motion of all examined joints. No ecchymosis or bruising. No gross deformity. No pain to palpation.   No images are attached to the encounter.    Results     Assessment:   1. Tinea pedis of both feet      Plan:  Patient was evaluated and treated and all questions answered.  Assessment and Plan Assessment & Plan Tinea pedis with associated plantar erythema and maceration Erythematous rash on the plantar forefoot with tenderness and maceration, present for three weeks. No peeling skin. Suspected tinea pedis due to symptoms and environmental factors, including prolonged shoe wear and potential exposure to contaminated shoes. - Prescribed oral fluconazole (Diflucan) once weekly for four weeks. - Prescribed topical antifungal and anti-inflammatory cream (Clotrim) to be applied twice daily. - Advised use of sanitizing spray inside shoes to prevent fungal growth. - Recommended alternating shoes and allowing him to air out between uses.  Plantar hyperhidrosis Increased sweating of the feet, contributing to a moist environment conducive to fungal growth. No itching reported. Symptoms exacerbated by prolonged shoe wear and potential moisture exposure. - Recommended over-the-counter Drysol (aluminum chloride) spray or liquid to be  applied twice daily to reduce sweating. - Advised frequent sock changes and allowing shoes to air out to prevent moisture accumulation.      Return in about 1 month (around 03/28/2024) for f/u skin rash / athletes foot.

## 2024-02-26 NOTE — ED Provider Notes (Signed)
 Joel Mills    CSN: [id number] Arrival date & time: 02/26/24  0850      History   Chief Complaint Chief Complaint  Patient presents with   Foot Pain    HPI Joel Mills is a 43 y.o. male.  Patient presents with 3-week history of bilateral foot pain on the balls of his feet.  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  The pain is worse with hyperextension of his toes and foot, weightbearing, ambulation.  He has attempted treatment by changing his footwear and socks without relief.  Both of the balls of his feet feel like they are swollen.  No trauma.  No numbness, weakness, wounds, redness, bruising.  Patient reports history of gastric ulcer and is unable to take NSAIDs.  The history is provided by the patient and medical records.    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 07/12/2023   Low testosterone  in male 08/13/2022   Chronic left hip pain 06/21/2022   Tinnitus 03/30/2022   History of gastric ulcer 10/27/2019   Chronic bilateral low back pain with bilateral sciatica 09/24/2019   Anxiety and depression 09/27/2012   Hernia 09/26/2012    Past Surgical History:  Procedure Laterality Date   ADENOIDECTOMY     ESOPHAGOGASTRODUODENOSCOPY (EGD) WITH PROPOFOL  N/A 01/07/2018   Procedure: ESOPHAGOGASTRODUODENOSCOPY (EGD) WITH PROPOFOL ;  Surgeon: Toledo, Ladell POUR, MD;  Location: [facility] ENDOSCOPY;  Service: Gastroenterology;  Laterality: N/A;   VASECTOMY         Home Medications    Prior to Admission medications   Not on File    Family History Family History  Problem Relation Age of Onset   Diabetes Mother    Hypertension Father    Depression Father    Cancer Sister    Depression Sister     Social History Social History   Tobacco Use    Smoking status: Former    Current packs/day: 0.00    Types: Cigarettes    Quit date: 11/30/2018    Years since quitting: 5.2   Smokeless tobacco: Never  Vaping Use   Vaping status: Never Used  Substance Use Topics   Alcohol use: Not Currently   Drug use: Not Currently    Types: Marijuana     Allergies   Cyclobenzaprine , Nsaids, and Ibuprofen   Review of Systems Review of Systems  Constitutional:  Negative for chills and fever.  Musculoskeletal:  Positive for arthralgias and gait problem. Negative for joint swelling.  Skin:  Negative for color change, rash and wound.  Neurological:  Negative for weakness and numbness.     Physical Exam Triage Vital Signs ED Triage Vitals  Encounter Vitals Group     BP 02/26/24 0952 117/89     Girls Systolic BP Percentile --      Girls Diastolic BP Percentile --      Boys Systolic BP Percentile --      Boys Diastolic BP Percentile --      Pulse Rate 02/26/24 0952 71     Resp 02/26/24 0952 18     Temp 02/26/24 0952 97.7 F (36.5 C)     Temp src --      SpO2 02/26/24  0952 99 %     Weight --      Height --      Head Circumference --      Peak Flow --      Pain Score 02/26/24 0948 4     Pain Loc --      Pain Education --      Exclude from Growth Chart --    No data found.  Updated Vital Signs BP 117/89   Pulse 71   Temp 97.7 F (36.5 C)   Resp 18   SpO2 99%   Visual Acuity Right Eye Distance:   Left Eye Distance:   Bilateral Distance:    Right Eye Near:   Left Eye Near:    Bilateral Near:     Physical Exam Constitutional:      General: He is not in acute distress. HENT:     Mouth/Throat:     Mouth: Mucous membranes are moist.  Cardiovascular:     Rate and Rhythm: Normal rate.  Pulmonary:     Effort: Pulmonary effort is normal. No respiratory distress.  Musculoskeletal:        General: Tenderness present. No swelling or deformity. Normal range of motion.       Feet:  Skin:    General: Skin is warm and dry.      Capillary Refill: Capillary refill takes less than 2 seconds.     Findings: No bruising, erythema, lesion or rash.  Neurological:     General: No focal deficit present.     Mental Status: He is alert.     Sensory: No sensory deficit.     Motor: No weakness.     Gait: Gait normal.      UC Treatments / Results  Labs (all labs ordered are listed, but only abnormal results are displayed) Labs Reviewed - No data to display  EKG   Radiology No results found.  Procedures Procedures (including critical care time)  Medications Ordered in UC Medications - No data to display  Initial Impression / Assessment and Plan / UC Course  I have reviewed the triage vital signs and the nursing notes.  Pertinent labs & imaging results that were available during my care of the patient were reviewed by me and considered in my medical decision making (see chart for details).    Bilateral foot pain.  Patient reports he is not able to take NSAIDs due to history of gastric ulcer.  Discussed rest, elevation, Tylenol .  Education provided on foot pain.  Instructed patient to follow-up with a podiatrist.  Contact information for on-call podiatrist provided as well as contact information for [facility].  Patient agrees to plan of care.  Final Clinical Impressions(s) / UC Diagnoses   Final diagnoses:  Pain in both feet     Discharge Instructions      Rest and keep your feet elevated.  Take Tylenol  as needed.  Follow-up with a podiatrist such as the one listed below.    Or   [facility] [address] [place], [place] [address] Phone: [phone]      ED Prescriptions   None    PDMP not reviewed this encounter.   Corlis Burnard DEL, NP 02/26/24 1102

## 2024-02-26 NOTE — ED Triage Notes (Signed)
 Patient to Urgent Care with complaints of  bilateral sided foot pain. Redness/ swelling. Reports constant stinging pain in the ball of his feet.   Symptoms x3 weeks. Possibly related to his work boots- has been changing his socks frequently.

## 2024-03-04 ENCOUNTER — Encounter: Payer: Self-pay | Admitting: Radiology

## 2024-03-17 ENCOUNTER — Other Ambulatory Visit: Payer: Self-pay

## 2024-03-17 ENCOUNTER — Emergency Department
Admission: EM | Admit: 2024-03-17 | Discharge: 2024-03-17 | Disposition: A | Discharge status: Home / Self Care | Attending: Emergency Medicine | Admitting: Emergency Medicine

## 2024-03-17 ENCOUNTER — Emergency Department

## 2024-03-17 DIAGNOSIS — F419 Anxiety disorder, unspecified: Secondary | ICD-10-CM | POA: Diagnosis not present

## 2024-03-17 DIAGNOSIS — J45909 Unspecified asthma, uncomplicated: Secondary | ICD-10-CM | POA: Insufficient documentation

## 2024-03-17 DIAGNOSIS — R079 Chest pain, unspecified: Secondary | ICD-10-CM

## 2024-03-17 DIAGNOSIS — K219 Gastro-esophageal reflux disease without esophagitis: Secondary | ICD-10-CM | POA: Insufficient documentation

## 2024-03-17 DIAGNOSIS — R0789 Other chest pain: Secondary | ICD-10-CM | POA: Diagnosis present

## 2024-03-17 DIAGNOSIS — R0602 Shortness of breath: Secondary | ICD-10-CM

## 2024-03-17 LAB — CBC
HCT: 43.4 % (ref 39.0–52.0)
Hemoglobin: 15.1 g/dL (ref 13.0–17.0)
MCH: 31.3 pg (ref 26.0–34.0)
MCHC: 34.8 g/dL (ref 30.0–36.0)
MCV: 89.9 fL (ref 80.0–100.0)
Platelets: 185 K/uL (ref 150–400)
RBC: 4.83 MIL/uL (ref 4.22–5.81)
RDW: 13.6 % (ref 11.5–15.5)
WBC: 6 K/uL (ref 4.0–10.5)
nRBC: 0 % (ref 0.0–0.2)

## 2024-03-17 LAB — BASIC METABOLIC PANEL WITH GFR
Anion gap: 8 (ref 5–15)
BUN: 15 mg/dL (ref 6–20)
CO2: 26 mmol/L (ref 22–32)
Calcium: 9.4 mg/dL (ref 8.9–10.3)
Chloride: 102 mmol/L (ref 98–111)
Creatinine, Ser: 1 mg/dL (ref 0.61–1.24)
GFR, Estimated: >60 mL/min (ref >60)
Glucose, Bld: 96 mg/dL (ref 70–99)
Potassium: 4.2 mmol/L (ref 3.5–5.1)
Sodium: 136 mmol/L (ref 135–145)

## 2024-03-17 LAB — GROUP A STREP BY PCR: Group A Strep by PCR: NOT DETECTED

## 2024-03-17 LAB — RESP PANEL BY RT-PCR (RSV, FLU A&B, COVID)  RVPGX2
Influenza A by PCR: NEGATIVE
Influenza B by PCR: NEGATIVE
Resp Syncytial Virus by PCR: NEGATIVE
SARS Coronavirus 2 by RT PCR: NEGATIVE

## 2024-03-17 LAB — D-DIMER, QUANTITATIVE: D-Dimer, Quant: <0.27 ug{FEU}/mL (ref 0.00–0.50)

## 2024-03-17 LAB — TROPONIN T, HIGH SENSITIVITY: Troponin T High Sensitivity: <15 ng/L (ref 0–19)

## 2024-03-17 MED ORDER — PANTOPRAZOLE SODIUM 40 MG PO TBEC
40.0000 mg | DELAYED_RELEASE_TABLET | Freq: Once | ORAL | Status: AC
  Administered 2024-03-17: 40 mg via ORAL
  Filled 2024-03-17: qty 1

## 2024-03-17 MED ORDER — HYDROXYZINE HCL 25 MG PO TABS
25.0000 mg | ORAL_TABLET | Freq: Once | ORAL | Status: AC
  Administered 2024-03-17: 25 mg via ORAL
  Filled 2024-03-17: qty 1

## 2024-03-17 MED ORDER — FAMOTIDINE 20 MG PO TABS: 20.0000 mg | ORAL_TABLET | Freq: Two times a day (BID) | ORAL | 0 refills | Status: DC

## 2024-03-17 MED ORDER — IPRATROPIUM-ALBUTEROL 0.5-2.5 (3) MG/3ML IN SOLN
9.0000 mL | Freq: Once | RESPIRATORY_TRACT | Status: AC
  Administered 2024-03-17: 9 mL via RESPIRATORY_TRACT
  Filled 2024-03-17: qty 9

## 2024-03-17 MED ORDER — LIDOCAINE VISCOUS HCL 2 % MT SOLN
15.0000 mL | Freq: Once | OROMUCOSAL | Status: AC
  Administered 2024-03-17: 15 mL via ORAL
  Filled 2024-03-17: qty 15

## 2024-03-17 MED ORDER — ALUM & MAG HYDROXIDE-SIMETH 200-200-20 MG/5ML PO SUSP
30.0000 mL | Freq: Once | ORAL | Status: AC
  Administered 2024-03-17: 30 mL via ORAL
  Filled 2024-03-17: qty 30

## 2024-03-17 NOTE — ED Notes (Signed)
 See triage note  Presents with sore throat nad some chest pressure  States sxs' started yesterday Low grade temp on arrival

## 2024-03-17 NOTE — Discharge Instructions (Addendum)
 Please take the medications as prescribed for your acid reflux symptoms.  Please be sure to follow-up with primary care doctor next week to get reassessed.

## 2024-03-17 NOTE — ED Triage Notes (Signed)
 Pt to ED for chest pain/pressure and sore throat since yesterday. States sometimes feels SOB. Pt has unlabored respirations, skin is dry.

## 2024-03-17 NOTE — ED Provider Notes (Signed)
 [facility] Provider Note    Event Date/Time   First MD Initiated Contact with Patient 03/17/24 1130     (approximate)   History   Chest Pain   HPI  Joel Mills is a 43 y.o. male with history of chronic back pain, anxiety, depression, asthma, presenting with chest pressure and sore throat that started yesterday.  Did have a slight cough.  States that he feels short of breath intermittently.  States that he feels like he cannot take a deep breath in all the way.  No fever.  Denies history of blood clots, no history of malignancies or leg swelling, no recent travel or surgeries.  Not on any hormones.  States that he was told that he had childhood asthma but does not use an inhaler.  Per independent history from wife, no other sick contacts, she noted that he seemed to be having trouble taking a deep breath.  On independent review, he was seen by Ortho in May for his chronic back pain primarily on the left side.  Does have history of peptic ulcer disease, unable to take NSAIDs.     Physical Exam   Triage Vital Signs: ED Triage Vitals  Encounter Vitals Group     BP 03/17/24 1118 (!) 142/100     Girls Systolic BP Percentile --      Girls Diastolic BP Percentile --      Boys Systolic BP Percentile --      Boys Diastolic BP Percentile --      Pulse Rate 03/17/24 1118 76     Resp 03/17/24 1118 20     Temp 03/17/24 1118 99.3 F (37.4 C)     Temp Source 03/17/24 1118 Oral     SpO2 03/17/24 1118 100 %     Weight 03/17/24 1117 218 lb (98.9 kg)     Height 03/17/24 1117 6' 2 (1.88 m)     Head Circumference --      Peak Flow --      Pain Score 03/17/24 1115 6     Pain Loc --      Pain Education --      Exclude from Growth Chart --     Most recent vital signs: Vitals:   03/17/24 1118  BP: (!) 142/100  Pulse: 76  Resp: 20  Temp: 99.3 F (37.4 C)  SpO2: 100%     General: Awake, no distress.  CV:  Good peripheral perfusion.  Resp:  Normal  effort.  Diminished breath sounds bilaterally, no obvious wheezing or crackles, no tachypnea or respiratory distress, no retractions Abd:  No distention.  Soft nontender Other:  Clear oropharynx, no tonsillar exudates or uvular deviation, he is maintaining his secretions, nontoxic-appearing, no unilateral calf swelling tenderness, no lower extremity edema   ED Results / Procedures / Treatments   Labs (all labs ordered are listed, but only abnormal results are displayed) Labs Reviewed  GROUP A STREP BY PCR  RESP PANEL BY RT-PCR (RSV, FLU A&B, COVID)  [id number]  BASIC METABOLIC PANEL WITH GFR  CBC  D-DIMER, QUANTITATIVE  TROPONIN T, HIGH SENSITIVITY     EKG  EKG shows, sinus rhythm with sinus arrhythmia, rate of 80, normal QS, normal QTc, no obvious ischemic ST elevation, mild T wave inversion to aVL, not significantly changed compared to prior   RADIOLOGY On my independent interpretation, chest x-ray without obvious consolidation   PROCEDURES:  Critical Care performed: No  Procedures   MEDICATIONS  ORDERED IN ED: Medications  ipratropium-albuterol (DUONEB) 0.5-2.5 (3) MG/3ML nebulizer solution 9 mL (9 mLs Nebulization Given 03/17/24 1201)  hydrOXYzine (ATARAX) tablet 25 mg (25 mg Oral Given 03/17/24 1316)  alum & mag hydroxide-simeth (MAALOX/MYLANTA) 200-200-20 MG/5ML suspension 30 mL (30 mLs Oral Given 03/17/24 1316)    And  lidocaine  (XYLOCAINE ) 2 % viscous mouth solution 15 mL (15 mLs Oral Given 03/17/24 1316)  pantoprazole  (PROTONIX ) EC tablet 40 mg (40 mg Oral Given 03/17/24 1315)     IMPRESSION / MDM / ASSESSMENT AND PLAN / ED COURSE  I reviewed the triage vital signs and the nursing notes.                              Differential diagnosis includes, but is not limited to, pharyngitis, viral illness, angina, ACS, did consider PE but he has no other risk factors for it, did consider CHF but he is not volume overloaded at this time, did also consider asthma given  the diminished breath sounds especially in the bases.  Will get labs, EKG, troponin, chest x-ray.  Strep swab, respiratory viral panel.  DuoNebs.  Reassess.  Patient's presentation is most consistent with acute presentation with potential threat to life or bodily function.  Independent interpretation of labs and imaging below.  On reassessment patient states the DuoNebs helped a little bit but still feels like he has the chest tightness, does have history of acid reflux and anxiety.  Will give him a GI cocktail and hydroxyzine here.  Reassess.  Reassessment patient is feeling a lot better.  Considered but no indication for inpatient mission at this time, he safe for outpatient management.  Will give a prescription for Pepcid.  Instructed to follow-up with primary care next week to get reassessed.  Strict return precautions given.  Shared decision making to patient and wife and they are agreeable with this plan.  Discharge.    Clinical Course as of 03/17/24 1428  Sun Mar 17, 2024  1149 Independent review of labs, electrolytes really deranged, no leukocytosis, troponins not elevated. [TT]  1150 DG Chest 2 View 1. No acute process.  [TT]  1211 Group A Strep by PCR: NOT DETECTED [TT]  1216 D-Dimer, Quant: <0.27 Not elevated [TT]  1234 Resp panel by RT-PCR (RSV, Flu A&B, Covid) Anterior Nasal Swab Negative [TT]    Clinical Course User Index [TT] Waymond Lorelle Cummins, MD     FINAL CLINICAL IMPRESSION(S) / ED DIAGNOSES   Final diagnoses:  Chest pain, unspecified type  Shortness of breath  Gastroesophageal reflux disease, unspecified whether esophagitis present  Anxiety     Rx / DC Orders   ED Discharge Orders          Ordered    famotidine (PEPCID) 20 MG tablet  2 times daily        03/17/24 1425             Note:  This document was prepared using Dragon voice recognition software and may include unintentional dictation errors.    Waymond Lorelle Cummins, MD 03/17/24 [phone]

## 2024-04-01 ENCOUNTER — Encounter: Payer: Self-pay | Admitting: Nurse Practitioner

## 2024-04-01 ENCOUNTER — Ambulatory Visit: Admitting: Nurse Practitioner

## 2024-04-01 VITALS — BP 124/89 | HR 85 | Temp 98.4°F | Resp 14 | Ht 74.02 in | Wt 210.4 lb

## 2024-04-01 DIAGNOSIS — J029 Acute pharyngitis, unspecified: Secondary | ICD-10-CM | POA: Diagnosis not present

## 2024-04-01 DIAGNOSIS — J069 Acute upper respiratory infection, unspecified: Secondary | ICD-10-CM | POA: Diagnosis not present

## 2024-04-01 DIAGNOSIS — K219 Gastro-esophageal reflux disease without esophagitis: Secondary | ICD-10-CM | POA: Diagnosis not present

## 2024-04-01 LAB — POC COVID19/FLU A&B COMBO
Covid Antigen, POC: NEGATIVE
Influenza A Antigen, POC: NEGATIVE
Influenza B Antigen, POC: NEGATIVE

## 2024-04-01 LAB — POCT RAPID STREP A (OFFICE): Rapid Strep A Screen: NEGATIVE

## 2024-04-01 MED ORDER — PREDNISONE 20 MG PO TABS: 40.0000 mg | ORAL_TABLET | Freq: Every day | ORAL | 0 refills | Status: AC

## 2024-04-01 NOTE — Patient Instructions (Signed)
 GERD in Adults: Diet Changes When you have gastroesophageal reflux disease (GERD), you may need to make changes to your diet. Choosing the right foods can help with your symptoms. Think about working with an expert in healthy eating called a dietitian. They can help you make healthy food choices. What are tips for following this plan? Reading food labels Look for foods that are low in saturated fat. Foods that may help with your symptoms include: Foods with less than 5% of daily value (DV) of fat. Foods with 0 grams of trans fat. Cooking [employer] in ways that don't use a lot of fat. These ways include: Baking. Steaming. Grilling. Broiling. To add flavor, try to use herbs that are low in spice and acidity. Avoid frying your food. Meal planning  Eat small meals often rather than eating 3 large meals each day. Eat your meals slowly in a place where you feel relaxed. If told by your health care provider, avoid: Foods that cause symptoms. Keep a food diary to keep track of foods that cause symptoms. Alcohol. Drinking a lot of liquid with meals. General instructions For 2-3 hours after you eat, avoid: Bending over. Exercise. Lying down. Chew sugar-free gum after meals. What foods should I eat? Eat a healthy diet. Try to include: Foods with high amounts of fiber. These include: Fruits and vegetables. Whole grains and beans. Low-fat dairy products. Lean meats, fish, and poultry. Egg whites. Foods that cause symptoms in someone else may not cause symptoms for you. Work with your provider to find foods that are safe for you. The items listed above may not be all the foods and drinks you can have. Talk with a dietitian to learn more. The items listed above may not be a complete list of foods and beverages you can eat and drink. Contact a dietitian for more information. What foods should I avoid? Limiting some of these foods may help with your symptoms. Each person is different.  Talk with a dietitian or your provider to help you find the exact foods to avoid. Some of the foods to avoid may include: Fruits Fruits with a lot of acid in them. These may include citrus fruits, such as oranges, grapefruit, pineapple, and lemons. Vegetables Deep-fried vegetables, such as [place] fries. Vegetables, sauces, or toppings made with added fat and vegetables with acid in them. These may include tomatoes and tomato products, chili peppers, onions, garlic, and horseradish. Grains Pastries or quick breads with added fat. Meats and other proteins High-fat meats, such as fatty beef or pork, hot dogs, ribs, ham, sausage, salami, and bacon. Fried meat or protein, such as fried fish and fried chicken. Egg yolks. Fats and oils Butter. Margarine. Shortening. Ghee. Drinks Coffee and other drinks with caffeine in them. Fizzy and sugary drinks, such as soda and energy drinks. Fruit juice made with acidic fruits, such as orange or grapefruit. Tomato juice. Sweets and desserts Chocolate and cocoa. Donuts. Seasonings and condiments Mint, such as peppermint and spearmint. Condiments, herbs, or seasonings that cause symptoms. These may include curry, hot sauce, or vinegar-based salad dressings. The items listed above may not be all the foods and drinks you should avoid. Talk with a dietitian to learn more. Questions to ask your health care provider Changes to your diet and everyday life are often the first steps taken to manage symptoms of GERD. If these changes don't help, talk with your provider about taking medicines. Where to find more information [employer]:  aboutgerd.org This information is not intended to replace advice given to you by your health care provider. Make sure you discuss any questions you have with your health care provider. Document Revised: 02/28/2023 Document Reviewed: 09/14/2022 Elsevier Patient Education  2024 [employer].

## 2024-04-01 NOTE — Progress Notes (Signed)
 BP 124/89 (BP Location: Left Arm, Patient Position: Sitting, Cuff Size: Large)   Pulse 85   Temp 98.4 F (36.9 C) (Oral)   Resp 14   Ht 6' 2.02 (1.88 m)   Wt 210 lb 6.4 oz (95.4 kg)   SpO2 99%   BMI 27.00 kg/m    Subjective:    Patient ID: Joel Mills, male    DOB: 15-Aug-1980, 43 y.o.   MRN: [id number]  HPI: Joel Mills is a 43 y.o. male  Chief Complaint  Patient presents with   Sore Throat    Started yesterday with burning. Today moved into sinus, congestion and coughing when he talks. No fevers but headaches.    Hospitalization Follow-up    Had chest pain and went to ED after 2 days. Nothing definitive from visit.  Some pains remains when he eats and swallows and notices pain still in the sternal region. Had previous episodes of heartburn.    UPPER RESPIRATORY TRACT INFECTION Started with sore throat yesterday morning and Saturday evening.  Having issues with vertigo, but this has been ongoing issue, especially with position changes, for a couple years. Fever: no Cough: yes Shortness of breath: no Wheezing: no Chest pain: discomfort with eating Chest tightness: no Chest congestion: yes Nasal congestion: yes Runny nose: yes Post nasal drip: yes Sneezing: no Sore throat: yes Swollen glands: no Sinus pressure: no Headache: yes Face pain: yes Toothache: no Ear pain: yes right Ear pressure: yes right Eyes red/itching:no Eye drainage/crusting: no  Vomiting: no Rash: no Fatigue: yes Sick contacts: yes during holidays Strep contacts: no  Context: fluctuating Recurrent sinusitis: no Relief with OTC cold/cough medications: no  Treatments attempted: cold/sinus    ER FOLLOW UP For chest pain, suspected to be GERD in ER setting. Was given GI cocktail which helped in ER and started on Pepcid , but he has not obtained this yet. Labs reassuring and imaging. Has dealt with reflux issues for years. Time since discharge: 3 weeks (03/17/24) Hospital/facility:  [facility] Diagnosis: chest pain unspecified Procedures/tests: chest imaging and labs Consultants: none New medications: Pepcid  Discharge instructions:  Follow-up with PCP Status: stable   Relevant past medical, surgical, family and social history reviewed and updated as indicated. Interim medical history since our last visit reviewed. Allergies and medications reviewed and updated.  Review of Systems  Constitutional:  Negative for activity change, appetite change, diaphoresis, fatigue and fever.  HENT:  Positive for congestion, ear pain, postnasal drip, rhinorrhea and sore throat. Negative for ear discharge, sinus pressure and sinus pain.   Respiratory:  Positive for cough and chest tightness. Negative for shortness of breath and wheezing.   Cardiovascular:  Negative for chest pain, palpitations and leg swelling.  Gastrointestinal: Negative.   Skin: Negative.   Neurological:  Positive for dizziness and headaches.  Psychiatric/Behavioral: Negative.      Per HPI unless specifically indicated above     Objective:    BP 124/89 (BP Location: Left Arm, Patient Position: Sitting, Cuff Size: Large)   Pulse 85   Temp 98.4 F (36.9 C) (Oral)   Resp 14   Ht 6' 2.02 (1.88 m)   Wt 210 lb 6.4 oz (95.4 kg)   SpO2 99%   BMI 27.00 kg/m   Wt Readings from Last 3 Encounters:  04/01/24 210 lb 6.4 oz (95.4 kg)  03/17/24 217 lb 13 oz (98.8 kg)  02/26/24 217 lb (98.4 kg)    Physical Exam Vitals and nursing note reviewed.  Constitutional:  General: He is awake. He is not in acute distress.    Appearance: He is well-developed and well-groomed. He is not ill-appearing or toxic-appearing.  HENT:     Head: Normocephalic.     Right Ear: Hearing, ear canal and external ear normal. No tenderness. A middle ear effusion is present. There is no impacted cerumen. Tympanic membrane is not injected or perforated.     Left Ear: Hearing, ear canal and external ear normal. No tenderness. A middle ear  effusion is present. There is no impacted cerumen. Tympanic membrane is not injected or perforated.     Nose: Rhinorrhea present. Rhinorrhea is clear.     Right Sinus: No maxillary sinus tenderness or frontal sinus tenderness.     Left Sinus: No maxillary sinus tenderness or frontal sinus tenderness.     Mouth/Throat:     Mouth: Mucous membranes are moist.     Pharynx: Posterior oropharyngeal erythema (mild) present. No pharyngeal swelling or oropharyngeal exudate.  Eyes:     General: Lids are normal.     Extraocular Movements: Extraocular movements intact.     Conjunctiva/sclera: Conjunctivae normal.  Neck:     Thyroid: No thyromegaly.     Vascular: No carotid bruit.  Cardiovascular:     Rate and Rhythm: Normal rate and regular rhythm.     Heart sounds: Normal heart sounds.  Pulmonary:     Effort: No accessory muscle usage or respiratory distress.     Breath sounds: Normal breath sounds. No decreased breath sounds, wheezing or rales.  Abdominal:     General: Bowel sounds are normal. There is no distension.     Palpations: Abdomen is soft.     Tenderness: There is no abdominal tenderness.  Musculoskeletal:     Cervical back: Full passive range of motion without pain.     Right lower leg: No edema.     Left lower leg: No edema.  Lymphadenopathy:     Cervical: No cervical adenopathy.  Skin:    General: Skin is warm.     Capillary Refill: Capillary refill takes less than 2 seconds.  Neurological:     Mental Status: He is alert and oriented to person, place, and time.     Deep Tendon Reflexes: Reflexes are normal and symmetric.     Reflex Scores:      Brachioradialis reflexes are 2+ on the right side and 2+ on the left side.      Patellar reflexes are 2+ on the right side and 2+ on the left side. Psychiatric:        Attention and Perception: Attention normal.        Mood and Affect: Mood normal.        Speech: Speech normal.        Behavior: Behavior normal. Behavior is  cooperative.        Thought Content: Thought content normal.    Results for orders placed or performed in visit on 04/01/24  POC Covid19/Flu A&B Antigen   Collection Time: 04/01/24  3:47 PM  Result Value Ref Range   Influenza A Antigen, POC Negative Negative   Influenza B Antigen, POC Negative Negative   Covid Antigen, POC Negative Negative  POCT rapid strep A   Collection Time: 04/01/24  3:47 PM  Result Value Ref Range   Rapid Strep A Screen Negative Negative      Assessment & Plan:   Problem List Items Addressed This Visit       Respiratory  URI (upper respiratory infection) - Primary   Acute for a couple days. Covid, Strep, and Flu testing negative. Will send in Prednisone  40 MG daily for 5 days for symptoms management. Too early for abx therapy, discussed with him. Recommend: - Increased rest - Increasing Fluids - Acetaminophen  / ibuprofen as needed for fever/pain.  - Salt water gargling, chloraseptic spray and throat lozenges - Mucinex.  - Saline sinus flushes or a neti pot.  - Humidifying the air.       Relevant Orders   POC Covid19/Flu A&B Antigen (Completed)   POCT rapid strep A (Completed)     Digestive   GERD without esophagitis   Chronic with recent exacerbation of symptoms. Has Pepcid  prescription and plans to pick up today. Recommend he keep a food journal and track foods that trigger symptoms. May need GI referral if ongoing issue.        Follow up plan: Return in about 5 weeks (around 05/06/2024) for GERD AND VERTIGO.

## 2024-04-01 NOTE — Assessment & Plan Note (Signed)
 Acute for a couple days. Covid, Strep, and Flu testing negative. Will send in Prednisone  40 MG daily for 5 days for symptoms management. Too early for abx therapy, discussed with him. Recommend: - Increased rest - Increasing Fluids - Acetaminophen  / ibuprofen as needed for fever/pain.  - Salt water gargling, chloraseptic spray and throat lozenges - Mucinex.  - Saline sinus flushes or a neti pot.  - Humidifying the air.

## 2024-04-01 NOTE — Assessment & Plan Note (Signed)
 Chronic with recent exacerbation of symptoms. Has Pepcid  prescription and plans to pick up today. Recommend he keep a food journal and track foods that trigger symptoms. May need GI referral if ongoing issue.

## 2024-04-04 ENCOUNTER — Telehealth

## 2024-05-11 NOTE — Patient Instructions (Signed)
 GERD in Adults: Diet Changes When you have gastroesophageal reflux disease (GERD), you may need to make changes to your diet. Choosing the right foods can help with your symptoms. Think about working with an expert in healthy eating called a dietitian. They can help you make healthy food choices. What are tips for following this plan? Reading food labels Look for foods that are low in saturated fat. Foods that may help with your symptoms include: Foods with less than 5% of daily value (DV) of fat. Foods with 0 grams of trans fat. Cooking [employer] in ways that don't use a lot of fat. These ways include: Baking. Steaming. Grilling. Broiling. To add flavor, try to use herbs that are low in spice and acidity. Avoid frying your food. Meal planning  Eat small meals often rather than eating 3 large meals each day. Eat your meals slowly in a place where you feel relaxed. If told by your health care provider, avoid: Foods that cause symptoms. Keep a food diary to keep track of foods that cause symptoms. Alcohol. Drinking a lot of liquid with meals. General instructions For 2-3 hours after you eat, avoid: Bending over. Exercise. Lying down. Chew sugar-free gum after meals. What foods should I eat? Eat a healthy diet. Try to include: Foods with high amounts of fiber. These include: Fruits and vegetables. Whole grains and beans. Low-fat dairy products. Lean meats, fish, and poultry. Egg whites. Foods that cause symptoms in someone else may not cause symptoms for you. Work with your provider to find foods that are safe for you. The items listed above may not be all the foods and drinks you can have. Talk with a dietitian to learn more. The items listed above may not be a complete list of foods and beverages you can eat and drink. Contact a dietitian for more information. What foods should I avoid? Limiting some of these foods may help with your symptoms. Each person is different.  Talk with a dietitian or your provider to help you find the exact foods to avoid. Some of the foods to avoid may include: Fruits Fruits with a lot of acid in them. These may include citrus fruits, such as oranges, grapefruit, pineapple, and lemons. Vegetables Deep-fried vegetables, such as [place] fries. Vegetables, sauces, or toppings made with added fat and vegetables with acid in them. These may include tomatoes and tomato products, chili peppers, onions, garlic, and horseradish. Grains Pastries or quick breads with added fat. Meats and other proteins High-fat meats, such as fatty beef or pork, hot dogs, ribs, ham, sausage, salami, and bacon. Fried meat or protein, such as fried fish and fried chicken. Egg yolks. Fats and oils Butter. Margarine. Shortening. Ghee. Drinks Coffee and other drinks with caffeine in them. Fizzy and sugary drinks, such as soda and energy drinks. Fruit juice made with acidic fruits, such as orange or grapefruit. Tomato juice. Sweets and desserts Chocolate and cocoa. Donuts. Seasonings and condiments Mint, such as peppermint and spearmint. Condiments, herbs, or seasonings that cause symptoms. These may include curry, hot sauce, or vinegar-based salad dressings. The items listed above may not be all the foods and drinks you should avoid. Talk with a dietitian to learn more. Questions to ask your health care provider Changes to your diet and everyday life are often the first steps taken to manage symptoms of GERD. If these changes don't help, talk with your provider about taking medicines. Where to find more information [employer]:  aboutgerd.org This information is not intended to replace advice given to you by your health care provider. Make sure you discuss any questions you have with your health care provider. Document Revised: 02/28/2023 Document Reviewed: 09/14/2022 Elsevier Patient Education  2024 [employer].

## 2024-05-14 ENCOUNTER — Ambulatory Visit: Admitting: Nurse Practitioner

## 2024-05-14 ENCOUNTER — Encounter: Payer: Self-pay | Admitting: Nurse Practitioner

## 2024-05-14 VITALS — BP 129/93 | HR 92 | Temp 99.6°F | Resp 16 | Ht 74.0 in | Wt 215.2 lb

## 2024-05-14 DIAGNOSIS — J069 Acute upper respiratory infection, unspecified: Secondary | ICD-10-CM

## 2024-05-14 DIAGNOSIS — K219 Gastro-esophageal reflux disease without esophagitis: Secondary | ICD-10-CM

## 2024-05-14 LAB — POC COVID19/FLU A&B COMBO
Covid Antigen, POC: NEGATIVE
Influenza A Antigen, POC: NEGATIVE
Influenza B Antigen, POC: NEGATIVE

## 2024-05-14 LAB — VERITOR FLU A/B WAIVED
Influenza A: NEGATIVE
Influenza B: NEGATIVE

## 2024-05-14 MED ORDER — FAMOTIDINE 20 MG PO TABS: 20.0000 mg | ORAL_TABLET | Freq: Two times a day (BID) | ORAL | 2 refills | Status: AC

## 2024-05-14 NOTE — Assessment & Plan Note (Signed)
 Acute for a few days. Covid and Flu negative. Discussed with him viral illness, that these often take 5-7 days to improved and abx therapy note recommended at this time. If ongoing symptoms next week he will alert PCP and will consider abx at that time. At this time recommend: - Increased rest - Increasing Fluids - Acetaminophen  / ibuprofen as needed for fever/pain.  - Salt water gargling, chloraseptic spray and throat lozenges - Mucinex.  - Saline sinus flushes or a neti pot.  - Humidifying the air.

## 2024-05-14 NOTE — Assessment & Plan Note (Signed)
 Improving with Pepcid . With his history of ulcers recommend he continue this as ordered since offering benefit. Continue to focus on diet changes at home. If any worsening symptoms consider GI referral.

## 2024-05-14 NOTE — Progress Notes (Signed)
 "  BP (!) 129/93 (BP Location: Left Arm, Patient Position: Sitting, Cuff Size: Normal)   Pulse 92   Temp 99.6 F (37.6 C) (Oral)   Resp 16   Ht 6' 2 (1.88 m)   Wt 215 lb 3.2 oz (97.6 kg)   BMI 27.63 kg/m    Subjective:    Patient ID: Joel Mills, male    DOB: 06-12-1980, 44 y.o.   MRN: [id number]  HPI: Joel Mills is a 44 y.o. male  Chief Complaint  Patient presents with   Follow-up    Follow up from hospital visit a few weeks back   Cough    Cough, congestion, body aches, headache   GERD Taking Pepcid  20 MG BID, often takes once a day. A couple days missed it in the morning and this caused worsening heart burn. Has cut back on pizza and spicy food. History of ulcers that ruptured and was hospitalized GERD control status: stable Satisfied with current treatment? yes Heartburn frequency: improving Medication side effects: no  Medication compliance: fluctuating Previous GERD medications: none Antacid use frequency:  occasionally Duration: months Alleviatiating factors: Pepcid  Aggravating factors: certain foods Dysphagia: no Odynophagia:  no Hematemesis: no Blood in stool: no EGD: no   UPPER RESPIRATORY TRACT INFECTION Started Saturday evening. Sunday woke up and had a little cough, took his wife's concoction. Monday felt a little bette rand went to work. Last night started to feel bad again.  Fever: yes noted this morning Cough: yes Shortness of breath: no Wheezing: no Chest pain: no Chest tightness: no Chest congestion: no Nasal congestion: yes Runny nose: yes Post nasal drip: yes Sneezing: no Sore throat: a little initially Swollen glands: no Sinus pressure: yes Headache: yes Face pain: no Toothache: no Ear pain: none Ear pressure: none Eyes red/itching:no Eye drainage/crusting: no  Vomiting: last night had a little Rash: no Fatigue: yes Sick contacts: no Strep contacts: no  Context: fluctuating Recurrent sinusitis: no Relief with OTC  cold/cough medications: yes  Treatments attempted: wife's concoction, Mucinex    Relevant past medical, surgical, family and social history reviewed and updated as indicated. Interim medical history since our last visit reviewed. Allergies and medications reviewed and updated.  Review of Systems  Constitutional:  Positive for fatigue and fever. Negative for activity change, appetite change, chills and diaphoresis.  HENT:  Positive for congestion, postnasal drip, rhinorrhea, sinus pressure and sore throat. Negative for ear discharge, ear pain, sinus pain, sneezing and voice change.   Respiratory:  Positive for cough and shortness of breath. Negative for chest tightness and wheezing.   Cardiovascular:  Negative for chest pain, palpitations and leg swelling.  Gastrointestinal: Negative.   Neurological:  Positive for headaches.  Psychiatric/Behavioral: Negative.      Per HPI unless specifically indicated above     Objective:    BP (!) 129/93 (BP Location: Left Arm, Patient Position: Sitting, Cuff Size: Normal)   Pulse 92   Temp 99.6 F (37.6 C) (Oral)   Resp 16   Ht 6' 2 (1.88 m)   Wt 215 lb 3.2 oz (97.6 kg)   BMI 27.63 kg/m   Wt Readings from Last 3 Encounters:  05/14/24 215 lb 3.2 oz (97.6 kg)  04/01/24 210 lb 6.4 oz (95.4 kg)  03/17/24 217 lb 13 oz (98.8 kg)    Physical Exam Vitals and nursing note reviewed.  Constitutional:      General: He is awake. He is not in acute distress.    Appearance: He  is well-developed and well-groomed. He is not ill-appearing or toxic-appearing.  HENT:     Head: Normocephalic.     Right Ear: Hearing, ear canal and external ear normal. No tenderness. A middle ear effusion is present. Tympanic membrane is not injected or perforated.     Left Ear: Hearing, ear canal and external ear normal. No tenderness. A middle ear effusion is present. Tympanic membrane is not injected or perforated.     Nose: Rhinorrhea present. Rhinorrhea is clear.      Right Sinus: No maxillary sinus tenderness or frontal sinus tenderness.     Left Sinus: No maxillary sinus tenderness or frontal sinus tenderness.     Mouth/Throat:     Mouth: Mucous membranes are moist.     Pharynx: Posterior oropharyngeal erythema (mild) and postnasal drip present. No pharyngeal swelling or oropharyngeal exudate.  Eyes:     General: Lids are normal.     Extraocular Movements: Extraocular movements intact.     Conjunctiva/sclera: Conjunctivae normal.  Neck:     Thyroid: No thyromegaly.     Vascular: No carotid bruit.  Cardiovascular:     Rate and Rhythm: Normal rate and regular rhythm.     Heart sounds: Normal heart sounds.  Pulmonary:     Effort: No accessory muscle usage or respiratory distress.     Breath sounds: Normal breath sounds. No decreased breath sounds, wheezing or rales.  Abdominal:     General: Bowel sounds are normal. There is no distension.     Palpations: Abdomen is soft.     Tenderness: There is no abdominal tenderness.  Musculoskeletal:     Cervical back: Full passive range of motion without pain.     Right lower leg: No edema.     Left lower leg: No edema.  Lymphadenopathy:     Head:     Right side of head: No submental, submandibular, tonsillar, preauricular or posterior auricular adenopathy.     Left side of head: No submental, submandibular, tonsillar, preauricular or posterior auricular adenopathy.     Cervical: No cervical adenopathy.  Skin:    General: Skin is warm.     Capillary Refill: Capillary refill takes less than 2 seconds.  Neurological:     Mental Status: He is alert and oriented to person, place, and time.     Deep Tendon Reflexes: Reflexes are normal and symmetric.     Reflex Scores:      Brachioradialis reflexes are 2+ on the right side and 2+ on the left side.      Patellar reflexes are 2+ on the right side and 2+ on the left side. Psychiatric:        Attention and Perception: Attention normal.        Mood and Affect:  Mood normal.        Speech: Speech normal.        Behavior: Behavior normal. Behavior is cooperative.        Thought Content: Thought content normal.     Results for orders placed or performed in visit on 04/01/24  POC Covid19/Flu A&B Antigen   Collection Time: 04/01/24  3:47 PM  Result Value Ref Range   Influenza A Antigen, POC Negative Negative   Influenza B Antigen, POC Negative Negative   Covid Antigen, POC Negative Negative  POCT rapid strep A   Collection Time: 04/01/24  3:47 PM  Result Value Ref Range   Rapid Strep A Screen Negative Negative  Assessment & Plan:   Problem List Items Addressed This Visit       Respiratory   URI (upper respiratory infection)   Acute for a few days. Covid and Flu negative. Discussed with him viral illness, that these often take 5-7 days to improved and abx therapy note recommended at this time. If ongoing symptoms next week he will alert PCP and will consider abx at that time. At this time recommend: - Increased rest - Increasing Fluids - Acetaminophen  / ibuprofen as needed for fever/pain.  - Salt water gargling, chloraseptic spray and throat lozenges - Mucinex.  - Saline sinus flushes or a neti pot.  - Humidifying the air.       Relevant Orders   POC Covid19/Flu A&B Antigen   Influenza A & B (STAT)     Digestive   GERD without esophagitis - Primary   Improving with Pepcid . With his history of ulcers recommend he continue this as ordered since offering benefit. Continue to focus on diet changes at home. If any worsening symptoms consider GI referral.      Relevant Medications   famotidine  (PEPCID ) 20 MG tablet     Follow up plan: Return for as scheduled May 13th for physical.      "

## 2024-09-11 ENCOUNTER — Encounter: Admitting: Nurse Practitioner

## 2025-01-16 ENCOUNTER — Ambulatory Visit: Admitting: Adult Health
